# Patient Record
Sex: Female | Born: 1940 | Hispanic: No | Marital: Married | Smoking: Never smoker
Health system: Southern US, Community
[De-identification: ages and names within clinical notes are randomized; demographics above are authoritative.]

## PROBLEM LIST (undated history)

## (undated) DIAGNOSIS — Z923 Personal history of irradiation: Secondary | ICD-10-CM

## (undated) DIAGNOSIS — H409 Unspecified glaucoma: Secondary | ICD-10-CM

## (undated) DIAGNOSIS — I10 Essential (primary) hypertension: Secondary | ICD-10-CM

## (undated) DIAGNOSIS — M810 Age-related osteoporosis without current pathological fracture: Secondary | ICD-10-CM

## (undated) DIAGNOSIS — C50312 Malignant neoplasm of lower-inner quadrant of left female breast: Principal | ICD-10-CM

## (undated) DIAGNOSIS — C50919 Malignant neoplasm of unspecified site of unspecified female breast: Secondary | ICD-10-CM

## (undated) DIAGNOSIS — E785 Hyperlipidemia, unspecified: Secondary | ICD-10-CM

## (undated) HISTORY — DX: Essential (primary) hypertension: I10

## (undated) HISTORY — DX: Malignant neoplasm of unspecified site of unspecified female breast: C50.919

## (undated) HISTORY — DX: Malignant neoplasm of lower-inner quadrant of left female breast: C50.312

## (undated) HISTORY — DX: Hyperlipidemia, unspecified: E78.5

## (undated) HISTORY — DX: Age-related osteoporosis without current pathological fracture: M81.0

## (undated) HISTORY — PX: FOOT SURGERY: SHX648

## (undated) HISTORY — DX: Unspecified glaucoma: H40.9

---

## 1998-07-02 ENCOUNTER — Other Ambulatory Visit: Admission: RE | Admit: 1998-07-02 | Discharge: 1998-07-02 | Payer: Self-pay | Admitting: Internal Medicine

## 1998-07-16 ENCOUNTER — Ambulatory Visit (HOSPITAL_COMMUNITY): Admission: RE | Admit: 1998-07-16 | Discharge: 1998-07-16 | Payer: Self-pay | Admitting: Internal Medicine

## 1999-09-08 ENCOUNTER — Ambulatory Visit (HOSPITAL_COMMUNITY): Admission: RE | Admit: 1999-09-08 | Discharge: 1999-09-08 | Payer: Self-pay | Admitting: Internal Medicine

## 1999-09-08 ENCOUNTER — Encounter: Payer: Self-pay | Admitting: Internal Medicine

## 1999-09-08 ENCOUNTER — Other Ambulatory Visit: Admission: RE | Admit: 1999-09-08 | Discharge: 1999-09-08 | Payer: Self-pay | Admitting: Internal Medicine

## 2000-10-18 ENCOUNTER — Other Ambulatory Visit: Admission: RE | Admit: 2000-10-18 | Discharge: 2000-10-18 | Payer: Self-pay | Admitting: Internal Medicine

## 2001-10-20 ENCOUNTER — Other Ambulatory Visit: Admission: RE | Admit: 2001-10-20 | Discharge: 2001-10-20 | Payer: Self-pay | Admitting: Internal Medicine

## 2002-07-06 ENCOUNTER — Emergency Department (HOSPITAL_COMMUNITY): Admission: EM | Admit: 2002-07-06 | Discharge: 2002-07-06 | Payer: Self-pay | Admitting: Emergency Medicine

## 2002-07-06 ENCOUNTER — Encounter: Payer: Self-pay | Admitting: Emergency Medicine

## 2002-08-04 ENCOUNTER — Encounter: Admission: RE | Admit: 2002-08-04 | Discharge: 2002-08-04 | Payer: Self-pay | Admitting: Internal Medicine

## 2002-08-04 ENCOUNTER — Encounter: Payer: Self-pay | Admitting: Internal Medicine

## 2004-04-04 ENCOUNTER — Ambulatory Visit (HOSPITAL_COMMUNITY): Admission: RE | Admit: 2004-04-04 | Discharge: 2004-04-04 | Payer: Self-pay | Admitting: Gastroenterology

## 2005-06-10 ENCOUNTER — Encounter: Admission: RE | Admit: 2005-06-10 | Discharge: 2005-09-08 | Payer: Self-pay | Admitting: Internal Medicine

## 2010-03-21 ENCOUNTER — Encounter: Admission: RE | Admit: 2010-03-21 | Discharge: 2010-03-21 | Payer: Self-pay | Admitting: Internal Medicine

## 2010-04-04 ENCOUNTER — Emergency Department (HOSPITAL_COMMUNITY): Admission: EM | Admit: 2010-04-04 | Discharge: 2010-04-04 | Payer: Self-pay | Admitting: Emergency Medicine

## 2011-01-19 ENCOUNTER — Encounter
Admission: RE | Admit: 2011-01-19 | Discharge: 2011-01-19 | Payer: Self-pay | Source: Home / Self Care | Attending: Internal Medicine | Admitting: Internal Medicine

## 2011-03-18 LAB — CBC
Hemoglobin: 12.4 g/dL (ref 12.0–15.0)
MCHC: 34.3 g/dL (ref 30.0–36.0)
MCV: 91.4 fL (ref 78.0–100.0)
RBC: 3.94 MIL/uL (ref 3.87–5.11)
RDW: 13.6 % (ref 11.5–15.5)

## 2011-03-18 LAB — URINALYSIS, ROUTINE W REFLEX MICROSCOPIC
Bilirubin Urine: NEGATIVE
Ketones, ur: NEGATIVE mg/dL
Nitrite: NEGATIVE
Specific Gravity, Urine: 1.021 (ref 1.005–1.030)
Urobilinogen, UA: 0.2 mg/dL (ref 0.0–1.0)
pH: 8.5 — ABNORMAL HIGH (ref 5.0–8.0)

## 2011-03-18 LAB — COMPREHENSIVE METABOLIC PANEL
ALT: 26 U/L (ref 0–35)
AST: 24 U/L (ref 0–37)
Albumin: 3.9 g/dL (ref 3.5–5.2)
Alkaline Phosphatase: 50 U/L (ref 39–117)
Chloride: 102 mEq/L (ref 96–112)
GFR calc Af Amer: >60 mL/min (ref >60)
Potassium: 3.6 mEq/L (ref 3.5–5.1)
Sodium: 141 mEq/L (ref 135–145)
Total Bilirubin: 1 mg/dL (ref 0.3–1.2)

## 2011-03-18 LAB — DIFFERENTIAL
Basophils Absolute: 0 10*3/uL (ref 0.0–0.1)
Basophils Relative: 0 % (ref 0–1)
Eosinophils Relative: 1 % (ref 0–5)
Monocytes Absolute: 0.7 10*3/uL (ref 0.1–1.0)
Monocytes Relative: 6 % (ref 3–12)

## 2011-04-07 IMAGING — CR DG LUMBAR SPINE COMPLETE 4+V
5 series · 5 of 5 positions shown · non-contrast
Comparison: None.

CLINICAL DATA: Bilateral low back pain radiating to the hips and
legs, no acute injury

LUMBAR SPINE - COMPLETE 4+ VIEW

[view not recorded (1 of 5)]
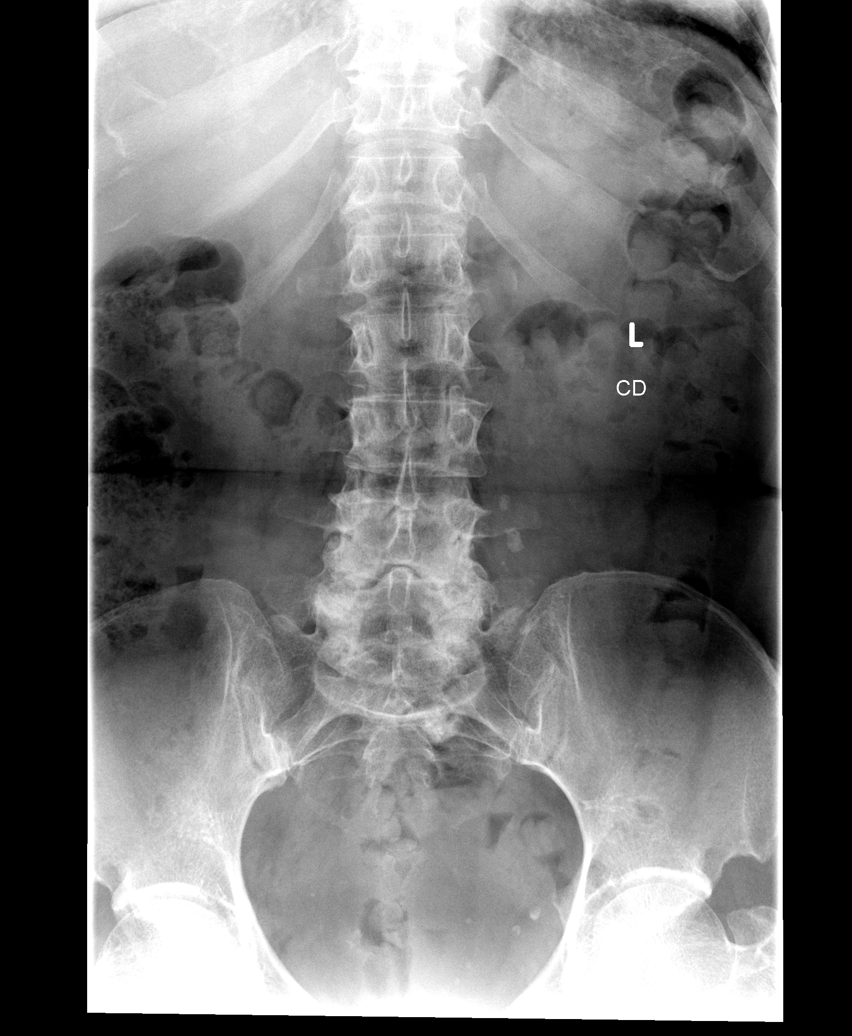

[view not recorded (2 of 5)]
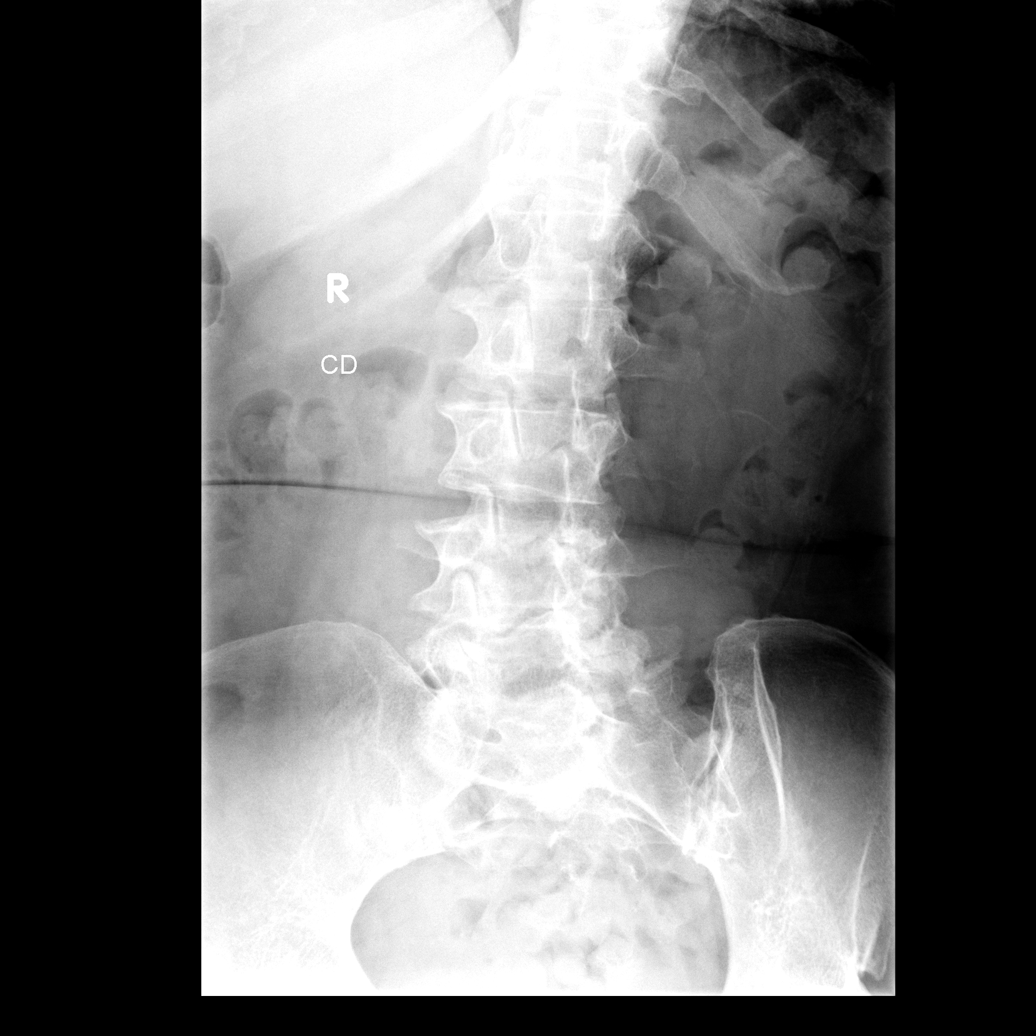

[view not recorded (3 of 5)]
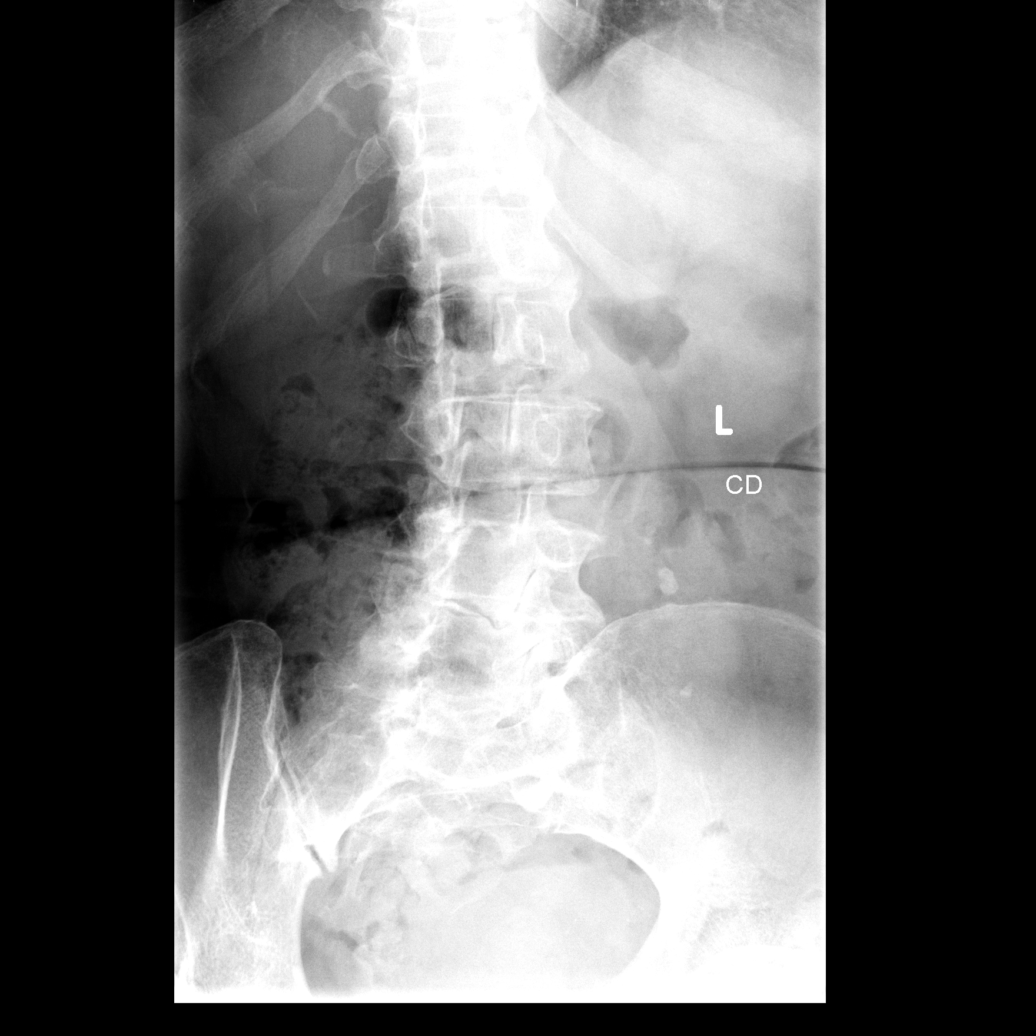

[view not recorded (4 of 5)]
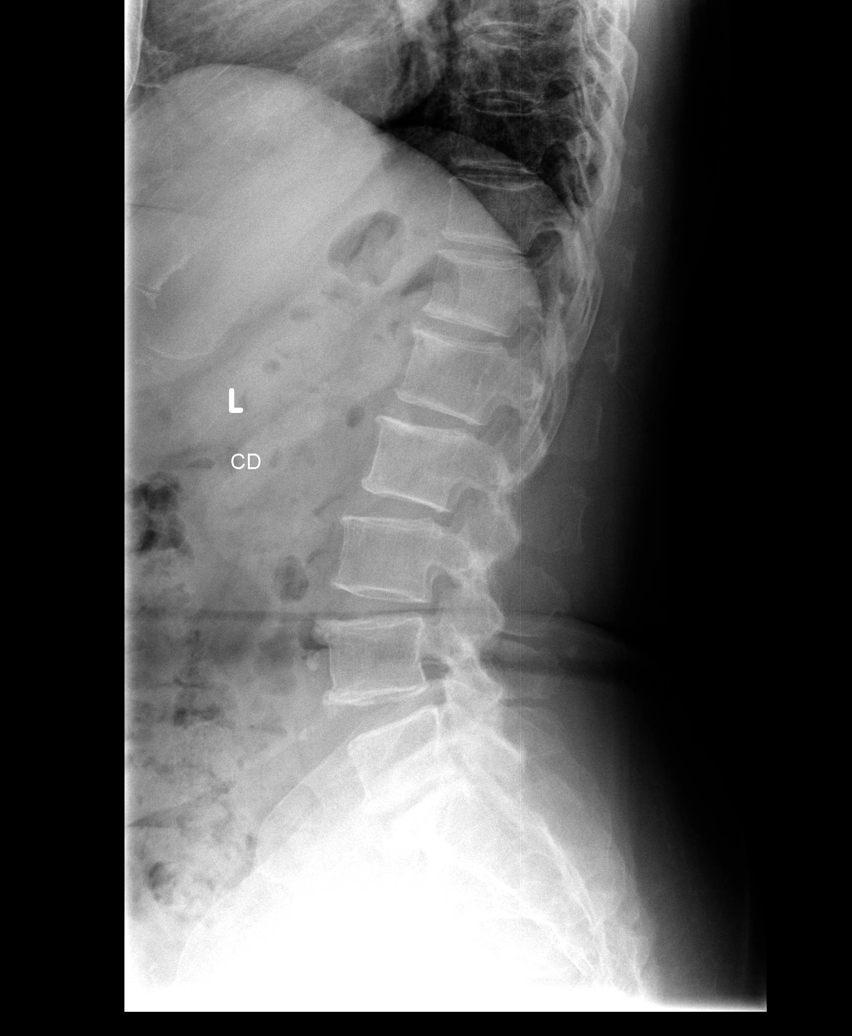

[view not recorded (5 of 5)]
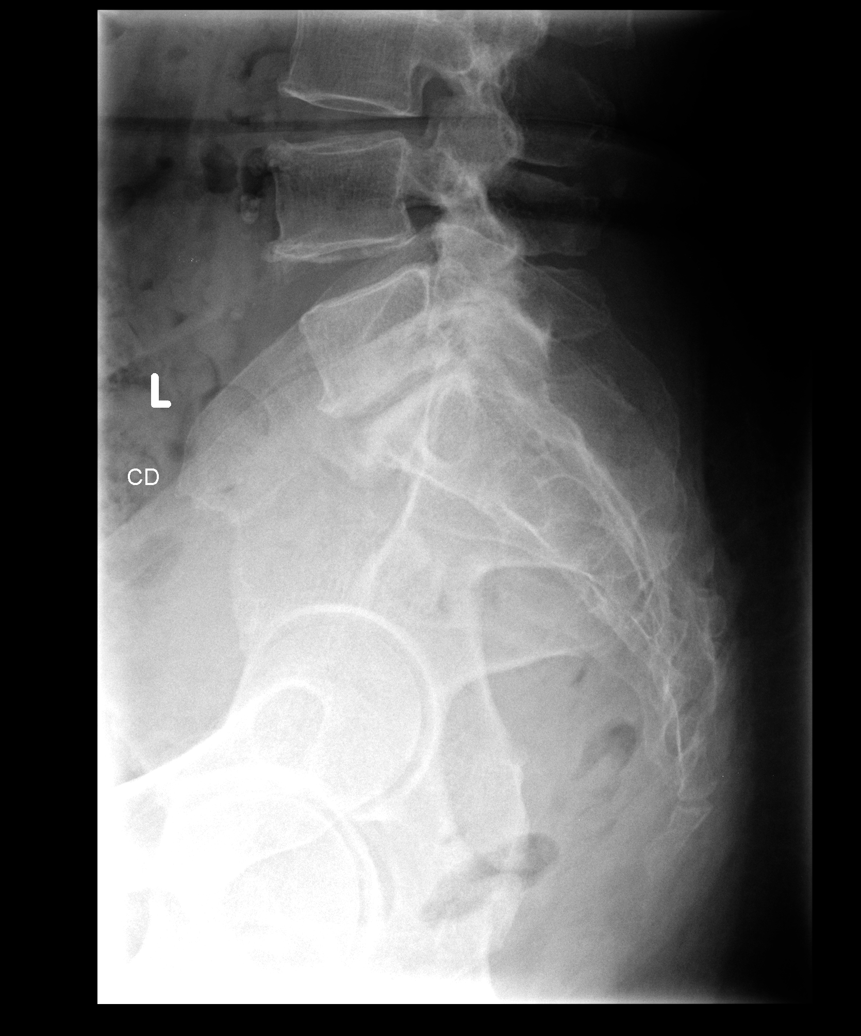

[5 of 5 positions shown; findings below may reference images not displayed]

FINDINGS: The lumbar vertebrae are in normal alignment.  There is
degenerative disc disease particularly at the L5 S1 level where
there is loss of disc space, sclerosis, and spurring.  No
compression deformity is seen.  There is degenerative change
involving the facet joints of L4-5 and L5-S1.  The SI joints appear
normal.
IMPRESSION: Degenerative disc disease at L5-S1.  Degenerative change involves
the facet joints of L4-5 and L5 S1 as well.

## 2011-05-15 NOTE — Op Note (Signed)
Cindy Whitaker, CRAGIN                          ACCOUNT NO.:  [id number]   MEDICAL RECORD NO.:  [id number]                   PATIENT TYPE:  AMB   LOCATION:  ENDO                                 FACILITY:  [facility]   PHYSICIAN:  Anselmo Rod, M.D.               DATE OF BIRTH:  [birth year]   DATE OF PROCEDURE:  04/04/2004  DATE OF DISCHARGE:                                 OPERATIVE REPORT   PROCEDURE:  Colonoscopy.   ENDOSCOPIST:  Charna Elizabeth, M.D.   INSTRUMENT USED:  Olympus video colonoscope.   INDICATIONS FOR PROCEDURE:  This is a 70 year old African American female  undergoing screening colonoscopy to rule out colonic polyps, masses, etc.   PROCEDURE PERFORMED:  Informed consent was procured from the patient.  The  patient fasted for eight hours prior to the procedure and prepped with a  bottle of magnesium citrate and a gallon of GOLYTELY the night prior to the  procedure.   PREPROCEDURE PHYSICAL EXAMINATION:  VITAL SIGNS:  The patient had stable  vital signs.  NECK:  Supple.  CHEST:  Clear to auscultation.  HEART:  S1 and S2 regular.  ABDOMEN:  Soft with normal bowel sounds.   DESCRIPTION OF PROCEDURE:  The patient was placed in the left lateral  decubitus position, sedated with 60 mg of Demerol and 6 mg of Versed  intravenously.  Once the patient was adequately sedated and maintained on  low flow oxygen and continuous cardiac monitoring, the Olympus video  colonoscope was advanced from the rectum to the cecum and terminal ileum  without difficulty.  The patient had a fairly good prep.  No masses, polyps,  erosions, ulcerations or diverticula was seen.  The appendiceal orifice and  ileocecal valve were clearly visualized and photographed.  Retroflexion in  the rectum revealed small internal hemorrhoids.  The patient tolerated the  procedure without immediate complications.   IMPRESSION:  Normal colonoscopy up to the terminal ileum except for small  nonbleeding internal  hemorrhoids.   RECOMMENDATIONS:  1. Continue a high fiber diet with liberal fluid intake.  2. Repeat CRC screening in the next 10 years unless the patient develops any     abnormal symptoms in the interim.  3. Outpatient followup as need arises in the future.                                               Anselmo Rod, M.D.    JNM/MEDQ  D:  04/04/2004  T:  04/04/2004  Job:  [id number]   cc:   Merlene Laughter. Renae Gloss, M.D.  [address]  [address]  [place]  [place] [address]  Fax: [phone]

## 2016-01-29 ENCOUNTER — Other Ambulatory Visit: Payer: Self-pay | Admitting: Internal Medicine

## 2016-01-29 DIAGNOSIS — Z1231 Encounter for screening mammogram for malignant neoplasm of breast: Secondary | ICD-10-CM

## 2016-03-13 ENCOUNTER — Encounter: Payer: Self-pay | Admitting: *Deleted

## 2016-03-13 ENCOUNTER — Telehealth: Payer: Self-pay | Admitting: *Deleted

## 2016-03-13 DIAGNOSIS — Z170 Estrogen receptor positive status [ER+]: Secondary | ICD-10-CM

## 2016-03-13 DIAGNOSIS — C50312 Malignant neoplasm of lower-inner quadrant of left female breast: Secondary | ICD-10-CM

## 2016-03-13 HISTORY — DX: Malignant neoplasm of lower-inner quadrant of left female breast: C50.312

## 2016-03-13 NOTE — Telephone Encounter (Signed)
Confirmed [facility] for 03/18/16 at 1215.  Instructions and contact information given.

## 2016-03-18 ENCOUNTER — Ambulatory Visit
Admission: RE | Admit: 2016-03-18 | Discharge: 2016-03-18 | Disposition: A | Discharge status: Home / Self Care | Payer: Medicare Other | Source: Ambulatory Visit | Attending: Radiation Oncology | Admitting: Radiation Oncology

## 2016-03-18 ENCOUNTER — Other Ambulatory Visit (HOSPITAL_BASED_OUTPATIENT_CLINIC_OR_DEPARTMENT_OTHER): Payer: Medicare Other

## 2016-03-18 ENCOUNTER — Ambulatory Visit: Payer: Medicare Other | Admitting: Physical Therapy

## 2016-03-18 ENCOUNTER — Telehealth: Payer: Self-pay | Admitting: Oncology

## 2016-03-18 ENCOUNTER — Ambulatory Visit (HOSPITAL_BASED_OUTPATIENT_CLINIC_OR_DEPARTMENT_OTHER): Payer: Medicare Other | Admitting: Oncology

## 2016-03-18 ENCOUNTER — Encounter: Payer: Self-pay | Admitting: Oncology

## 2016-03-18 ENCOUNTER — Ambulatory Visit: Payer: Self-pay | Admitting: Surgery

## 2016-03-18 VITALS — BP 167/67 | HR 67 | Temp 97.9°F | Resp 18 | Wt 171.5 lb

## 2016-03-18 DIAGNOSIS — C50312 Malignant neoplasm of lower-inner quadrant of left female breast: Secondary | ICD-10-CM

## 2016-03-18 DIAGNOSIS — D0512 Intraductal carcinoma in situ of left breast: Secondary | ICD-10-CM

## 2016-03-18 LAB — CBC WITH DIFFERENTIAL/PLATELET
BASO%: 0.3 % (ref 0.0–2.0)
Basophils Absolute: 0 10*3/uL (ref 0.0–0.1)
EOS ABS: 0.1 10*3/uL (ref 0.0–0.5)
EOS%: 1.4 % (ref 0.0–7.0)
HCT: 34.2 % — ABNORMAL LOW (ref 34.8–46.6)
HEMOGLOBIN: 12.2 g/dL (ref 11.6–15.9)
LYMPH%: 46.9 % (ref 14.0–49.7)
MCH: 30.7 pg (ref 25.1–34.0)
MCHC: 35.7 g/dL (ref 31.5–36.0)
MCV: 86.1 fL (ref 79.5–101.0)
MONO#: 0.8 10*3/uL (ref 0.1–0.9)
MONO%: 8.3 % (ref 0.0–14.0)
NEUT%: 43.1 % (ref 38.4–76.8)
NEUTROS ABS: 4.1 10*3/uL (ref 1.5–6.5)
Platelets: 242 10*3/uL (ref 145–400)
RBC: 3.97 10*6/uL (ref 3.70–5.45)
RDW: 12.9 % (ref 11.2–14.5)
WBC: 9.5 10*3/uL (ref 3.9–10.3)
lymph#: 4.5 10*3/uL — ABNORMAL HIGH (ref 0.9–3.3)

## 2016-03-18 LAB — COMPREHENSIVE METABOLIC PANEL
ALK PHOS: 63 U/L (ref 40–150)
ALT: 16 U/L (ref 0–55)
ANION GAP: 6 meq/L (ref 3–11)
AST: 20 U/L (ref 5–34)
Albumin: 3.8 g/dL (ref 3.5–5.0)
BUN: 15.9 mg/dL (ref 7.0–26.0)
CHLORIDE: 106 meq/L (ref 98–109)
CO2: 27 mEq/L (ref 22–29)
CREATININE: 1.1 mg/dL (ref 0.6–1.1)
Calcium: 9.6 mg/dL (ref 8.4–10.4)
EGFR: 50 mL/min/{1.73_m2} — ABNORMAL LOW (ref >90)
Glucose: 108 mg/dl (ref 70–140)
Potassium: 3.9 mEq/L (ref 3.5–5.1)
SODIUM: 139 meq/L (ref 136–145)
TOTAL PROTEIN: 8.1 g/dL (ref 6.4–8.3)
Total Bilirubin: 0.63 mg/dL (ref 0.20–1.20)

## 2016-03-18 NOTE — Progress Notes (Signed)
Radiation Oncology         [phone] ________________________________  Initial outpatient Consultation  Name: Cindy Whitaker MRN: [id number]  Date: 03/18/2016  DOB: 04-02-1941  TD:DUKGURK Sandrea Hughs, MD  Erroll Luna, MD   REFERRING PHYSICIAN: Erroll Luna, MD  DIAGNOSIS:    ICD-9-CM ICD-10-CM   1. 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 female who presented with a Left breast mass on screening mammography, 63m on [place] axilla normal on [place]  Biopsy showed Low grade DCIS arising from a papilloma with characteristics as described above in the diagnosis.  She is otherwise in her USOH.  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    PAST SURGICAL HISTORY: Past Surgical History  Procedure Laterality Date  . Foot surgery      FAMILY HISTORY: family history is not on file.  SOCIAL HISTORY:  reports that she has never smoked. She does not have any smokeless tobacco history on file. She reports that she does not drink alcohol or use illicit drugs.  ALLERGIES: Review of patient's allergies indicates no known allergies.  MEDICATIONS:  Current Outpatient Prescriptions  Medication Sig Dispense Refill  . amLODipine (NORVASC) 5 MG tablet TK 1 T PO QD  5  . atorvastatin (LIPITOR) 10 MG tablet Take 10 mg by mouth daily.    . dorzolamide-timolol (COSOPT) 22.3-6.8 MG/ML ophthalmic solution Place 1 drop into the left eye 2 (two) times daily.    .Marland Kitchenlatanoprost (XALATAN) 0.005 % ophthalmic solution Place 1 drop into both eyes at bedtime.      .Marland Kitchenlosartan (COZAAR) 50 MG tablet Take 50 mg by mouth daily.     No current facility-administered medications for this encounter.    REVIEW OF SYSTEMS:  Notable for that above.   PHYSICAL EXAM:    Vitals with BMI 03/18/2016  Weight 1270lbs 8 oz  Systolic 1623 Diastolic 67  Pulse 67  Respirations 18   General: Alert and oriented, in no acute distress HEENT: Head is normocephalic. Extraocular movements are intact. Oropharynx is clear. Neck: Neck is supple, no palpable cervical or supraclavicular lymphadenopathy. Heart: Regular in rate and rhythm with no murmurs, rubs, or gallops. Chest: Clear to auscultation bilaterally, with no rhonchi, wheezes, or rales. Abdomen: Soft, nontender, nondistended, with no rigidity or guarding. Extremities: No cyanosis or edema. Lymphatics: see Neck Exam Skin: No concerning lesions. Musculoskeletal: symmetric strength and muscle tone throughout. Neurologic: Cranial nerves II through XII are grossly intact. No obvious focalities. Speech is fluent. Coordination is intact. Psychiatric: Judgment and insight are intact. Affect is appropriate. Breasts: lower left breast thickening, about 1.5-2cm span, at biopsy site . No other palpable masses appreciated in the breasts or axillae bilaterally .    ECOG = 0  0 - Asymptomatic (Fully active, able to carry on all predisease activities without restriction)  1 - Symptomatic but completely ambulatory (Restricted in physically strenuous activity but ambulatory and able to carry out work of a light or sedentary nature. For example, light housework, office work)  2 - Symptomatic, <50% in bed  during the day (Ambulatory and capable of all self care but unable to carry out any work activities. Up and about more than 50% of waking hours)  3 - Symptomatic, >50% in bed, but not bedbound (Capable of only limited self-care, confined to bed or chair 50% or more of waking hours)  4 - Bedbound (Completely disabled. Cannot  carry on any self-care. Totally confined to bed or chair)  5 - Death   [name] MM, Creech RH, Tormey DC, et al. [phone]). "Toxicity and response criteria of the [facility]". [place] Oncol. 5 (6): 649-55   LABORATORY DATA:  Lab Results  Component Value Date   WBC 9.5 03/18/2016   HGB 12.2 03/18/2016   HCT 34.2* 03/18/2016   MCV 86.1 03/18/2016   PLT 242 03/18/2016   CMP     Component Value Date/Time   NA 139 03/18/2016 1240   NA 141 04/04/2010 0236   K 3.9 03/18/2016 1240   K 3.6 04/04/2010 0236   CL 102 04/04/2010 0236   CO2 27 03/18/2016 1240   CO2 31 04/04/2010 0236   GLUCOSE 108 03/18/2016 1240   GLUCOSE 124* 04/04/2010 0236   BUN 15.9 03/18/2016 1240   BUN 23 04/04/2010 0236   CREATININE 1.1 03/18/2016 1240   CREATININE 0.98 04/04/2010 0236   CALCIUM 9.6 03/18/2016 1240   CALCIUM 9.2 04/04/2010 0236   PROT 8.1 03/18/2016 1240   PROT 7.8 04/04/2010 0236   ALBUMIN 3.8 03/18/2016 1240   ALBUMIN 3.9 04/04/2010 0236   AST 20 03/18/2016 1240   AST 24 04/04/2010 0236   ALT 16 03/18/2016 1240   ALT 26 04/04/2010 0236   ALKPHOS 63 03/18/2016 1240   ALKPHOS 50 04/04/2010 0236   BILITOT 0.63 03/18/2016 1240   BILITOT 1.0 04/04/2010 0236   GFRNONAA 57* 04/04/2010 0236   GFRAA  04/04/2010 0236    >60        The eGFR has been calculated using the MDRD equation. This calculation has not been validated in all clinical situations. eGFR's persistently <60 mL/min signify possible Chronic Kidney Disease.         RADIOGRAPHY: No results found.    IMPRESSION/PLAN: Low grade left breast DCIS, ER+  She has been discussed at our multidisciplinary tumor board.  The consensus is that she would be a good candidate for breast conservation. I talked to her about the option of a mastectomy and informed her that her expected overall survival would be equivalent between mastectomy and breast conservation, based upon randomized controlled data. She is  enthusiastic about breast conservation.  It was a pleasure meeting the patient today. We discussed the risks, benefits, and side effects of radiotherapy. I recommend reviewing her final pathology before determining if radiotherapy to the Left breast is needed.  We discussed that radiation would take approximately 5-6 weeks to complete and that I would give the patient a few weeks to heal following surgery before starting treatment planning. We spoke about acute effects including skin irritation and fatigue as well as much less common late effects including internal organ injury or irritation. We spoke about the latest technology that is used to minimize the risk of late effects for patients undergoing radiotherapy to the breast or chest wall. No guarantees of treatment were given.   As discussed, I am not sure RT will be warranted for what appears to be relatively low risk disease.  We'll discuss her pathology post surgery and determine if followup and/or  treatment planning is needed.    __________________________________________   Eppie Gibson, MD

## 2016-03-18 NOTE — H&P (Signed)
Cindy Whitaker 03/18/2016 8:00 AM Location: [facility] Patient #: [id number] DOB: January 03, 1941 Undefined / Language: Cindy Whitaker / Race: Undefined Female  History of Present Illness Cindy Moores A. Hasaan Radde MD; 03/18/2016 2:49 PM) Patient words: Pt sent at the request of Dr Cindy Whitaker for left breast mass detected on mammogram. Bx shows 9 mm DCIS lo grade withinpapilloma. Pt has no breast pain, mass or discharge. No family history of breast cancer.  The patient is a 75 year old female.   Other Problems Cindy Slipper, RN; 03/18/2016 8:00 AM) Arthritis Back Pain Bladder Problems High blood pressure Hypercholesterolemia  Past Surgical History Cindy Slipper, RN; 03/18/2016 8:00 AM) Foot Surgery Right.  Diagnostic Studies History Cindy Slipper, RN; 03/18/2016 8:00 AM) Colonoscopy within last year Mammogram within last year Pap Smear 1-5 years ago  Medication History Cindy Slipper, RN; 03/18/2016 8:00 AM) No Current Medications Medications Reconciled  Social History Cindy Slipper, RN; 03/18/2016 8:00 AM) Caffeine use Coffee, Tea. No alcohol use No drug use Tobacco use Never smoker.  Family History Cindy Slipper, RN; 03/18/2016 8:00 AM) Hypertension Brother, Sister. Thyroid problems Sister.  Pregnancy / Birth History Cindy Slipper, RN; 03/18/2016 8:00 AM) Age at menarche 55 years. Age of menopause 69-50 Gravida 31 Maternal age 4-25 Para 3     Review of Systems Cindy Slipper RN; 03/18/2016 8:00 AM) General Not Present- Appetite Loss, Chills, Fatigue, Fever, Night Sweats, Weight Gain and Weight Loss. Skin Not Present- Change in Wart/Mole, Dryness, Hives, Jaundice, New Lesions, Non-Healing Wounds, Rash and Ulcer. HEENT Present- Seasonal Allergies and Wears glasses/contact lenses. Not Present- Earache, Hearing Loss, Hoarseness, Nose Bleed, Oral Ulcers, Ringing in the Ears, Sinus Pain, Sore Throat, Visual Disturbances and Yellow Eyes. Respiratory Present- Snoring. Not  Present- Bloody sputum, Chronic Cough, Difficulty Breathing and Wheezing. Breast Not Present- Breast Mass, Breast Pain, Nipple Discharge and Skin Changes. Cardiovascular Not Present- Chest Pain, Difficulty Breathing Lying Down, Leg Cramps, Palpitations, Rapid Heart Rate, Shortness of Breath and Swelling of Extremities. Gastrointestinal Not Present- Abdominal Pain, Bloating, Bloody Stool, Change in Bowel Habits, Chronic diarrhea, Constipation, Difficulty Swallowing, Excessive gas, Gets full quickly at meals, Hemorrhoids, Indigestion, Nausea, Rectal Pain and Vomiting. Female Genitourinary Present- Nocturia. Not Present- Frequency, Painful Urination, Pelvic Pain and Urgency. Musculoskeletal Present- Back Pain. Not Present- Joint Pain, Joint Stiffness, Muscle Pain, Muscle Weakness and Swelling of Extremities. Neurological Not Present- Decreased Memory, Fainting, Headaches, Numbness, Seizures, Tingling, Tremor, Trouble walking and Weakness. Psychiatric Not Present- Anxiety, Bipolar, Change in Sleep Pattern, Depression, Fearful and Frequent crying. Endocrine Not Present- Cold Intolerance, Excessive Hunger, Hair Changes, Heat Intolerance, Hot flashes and New Diabetes. Hematology Not Present- Easy Bruising, Excessive bleeding, Gland problems, HIV and Persistent Infections.   Physical Exam (Cindy Gascoigne A. Hannia Matchett MD; 03/18/2016 2:50 PM)  General Mental Status-Alert. General Appearance-Consistent with stated age. Hydration-Well hydrated. Voice-Normal.  Head and Neck Head-normocephalic, atraumatic with no lesions or palpable masses. Trachea-midline. Thyroid Gland Characteristics - normal size and consistency.  Eye Eyeball - Bilateral-Extraocular movements intact. Sclera/Conjunctiva - Bilateral-No scleral icterus.  Chest and Lung Exam Chest and lung exam reveals -quiet, even and easy respiratory effort with no use of accessory muscles and on auscultation, normal breath sounds, no  adventitious sounds and normal vocal resonance. Inspection Chest Wall - Normal. Back - normal.  Breast Breast - Left-Symmetric, Non Tender, No Biopsy scars, no Dimpling, No Inflammation, No Lumpectomy scars, No Mastectomy scars, No [place]. Breast - Right-Symmetric, Non Tender, No Biopsy scars, no Dimpling, No Inflammation, No Lumpectomy scars, No Mastectomy  scars, No Peau d' [place]. Breast Lump-No Palpable Breast Mass. Note: small medial left breast hematoma  Cardiovascular Cardiovascular examination reveals -normal heart sounds, regular rate and rhythm with no murmurs and normal pedal pulses bilaterally.  Abdomen Inspection Inspection of the abdomen reveals - No Hernias. Skin - Scar - no surgical scars. Palpation/Percussion Palpation and Percussion of the abdomen reveal - Soft, Non Tender, No Rebound tenderness, No Rigidity (guarding) and No hepatosplenomegaly. Auscultation Auscultation of the abdomen reveals - Bowel sounds normal.  Neurologic Neurologic evaluation reveals -alert and oriented x 3 with no impairment of recent or remote memory. Mental Status-Normal.  Musculoskeletal Normal Exam - Left-Upper Extremity Strength Normal and Lower Extremity Strength Normal. Normal Exam - Right-Upper Extremity Strength Normal and Lower Extremity Strength Normal.  Lymphatic Head & Neck  General Head & Neck Lymphatics: Bilateral - Description - Normal. Axillary  General Axillary Region: Bilateral - Description - Normal. Tenderness - Non Tender. Femoral & Inguinal - Did not examine.    Assessment & Plan (Cindy Auble A. Johnel Yielding MD; 03/18/2016 2:51 PM)  DCIS (DUCTAL CARCINOMA IN SITU), LEFT (D05.12) Impression: discussed breast conservation vs mastectomy.  Pt has opted for left breast lumpectomy  Risk of lumpectomy include bleeding, infection, seroma, more surgery, use of seed/wire, wound care, cosmetic deformity and the need for other treatments, death , blood  clots, death. Pt agrees to proceed.  Current Plans Pt Education - [facility] Breast Biopsy HCI: discussed with patient and provided information. We discussed the staging and pathophysiology of breast cancer. We discussed all of the different options for treatment for breast cancer including surgery, chemotherapy, radiation therapy, Herceptin, and antiestrogen therapy. We discussed a sentinel lymph node biopsy as she does not appear to having lymph node involvement right now. We discussed the performance of that with injection of radioactive tracer and blue dye. We discussed that she would have an incision underneath her axillary hairline. We discussed that there is a bout a 10-20% chance of having a positive node with a sentinel lymph node biopsy and we will await the permanent pathology to make any other first further decisions in terms of her treatment. One of these options might be to return to the operating room to perform an axillary lymph node dissection. We discussed about a 1-2% risk lifetime of chronic shoulder pain as well as lymphedema associated with a sentinel lymph node biopsy. We discussed the options for treatment of the breast cancer which included lumpectomy versus a mastectomy. We discussed the performance of the lumpectomy with a wire placement. We discussed a 10-20% chance of a positive margin requiring reexcision in the operating room. We also discussed that she may need radiation therapy or antiestrogen therapy or both if she undergoes lumpectomy. We discussed the mastectomy and the postoperative care for that as well. We discussed that there is no difference in her survival whether she undergoes lumpectomy with radiation therapy or antiestrogen therapy versus a mastectomy. There is a slight difference in the local recurrence rate being 3-5% with lumpectomy and about 1% with a mastectomy. We discussed the risks of operation including bleeding, infection, possible reoperation. She understands  her further therapy will be based on what her stages at the time of her operation.  Pt Education - [facility] Breast Cancer Information Given - [facility] "Breast Journey" Package You are being scheduled for surgery - Our schedulers will call you.  You should hear from our office's scheduling department within 5 working days about the location, date, and time of surgery.  We try to make accommodations for patient's preferences in scheduling surgery, but sometimes the OR schedule or the surgeon's schedule prevents [place] from making those accommodations.  If you have not heard from our office [phone]) in 5 working days, call the office and ask for your surgeon's nurse.  If you have other questions about your diagnosis, plan, or surgery, call the office and ask for your surgeon's nurse.

## 2016-03-18 NOTE — Progress Notes (Signed)
[place]  Telephone:([phone] Fax:([phone]     ID: Cindy Whitaker DOB: May 03, 1941  MR#: [id number]  KCL#:[id number]  Patient Care Team: Haywood Pao, MD as PCP - General (Internal Medicine) Chauncey Cruel, MD as Consulting Physician (Oncology) Erroll Luna, MD as Consulting Physician (General Surgery) Eppie Gibson, MD as Attending Physician (Radiation Oncology) Juanita Craver, MD as Consulting Physician (Gastroenterology) Iven Finn, DMD (Dentistry) Cherlyn Roberts, OD (Optometry) PCP: Haywood Pao, MD OTHER MD:  CHIEF COMPLAINT: Ductal carcinoma in situ  CURRENT TREATMENT: Awaiting definitive surgery   BREAST CANCER HISTORY: The patient had routine screening mammography at [facility] 03/02/2016 with tomosynthesis. This showed the breast density to be category be. In the left breast lower inner quadrant there was a new oval mass and ultrasound performed the next day confirmed a 9 mm lobulated mass which was hypoechoic and showed increased vascularity. The left axilla was sonographically benign.  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  Her subsequent history is as detailed below  INTERVAL HISTORY: Cindy Whitaker was evaluated in the multidisciplinary breast cancer clinic 03/18/2016, accompanied by her husband Cindy Whitaker and their daughter Cindy Whitaker. Her case was also presented in the multidisciplinary breast cancer conference that same morning. At that time a preliminary plan was proposed: Breast conserving surgery, with consideration of radiation depending on margin and other pathology results, then anti-estrogens.  REVIEW OF SYSTEMS: There were no specific symptoms leading to the original mammogram, which was routinely  scheduled. The patient denies unusual headaches, visual changes, nausea, vomiting, stiff neck, dizziness, or gait imbalance. There has been no cough, phlegm production, or pleurisy, no chest pain or pressure, and no change in bowel or bladder habits. The patient denies fever, rash, bleeding, unexplained fatigue or unexplained weight loss. She admits to some low back pain and other areas of arthritis which are not more intense or persistent in discomfort than prior. She has rare headaches. [facility]. A detailed review of systems was otherwise entirely negative.  PAST MEDICAL HISTORY: Past Medical History  Diagnosis Date  . 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 Breast cancer ([place])   . Hypertension   . Osteoporosis   . Hyperlipidemia   . Glaucoma     PAST SURGICAL HISTORY: Past Surgical History  Procedure Laterality Date  . Foot surgery      FAMILY HISTORY History reviewed. No pertinent family history. The patient's father died at the age of 2, from "old age" the patient's mother died from tuberculosis at the age of 5. The patient has 3 brothers, 5 sisters. There is no history of breast or ovarian cancer in the family to her knowledge  GYNECOLOGIC HISTORY:  No LMP recorded. Menarche age 24, first live birth age 41. The patient is GX P3. She stopped having periods in April 1995. She did not take hormone replacement. She never used oral contraceptives.  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 She is originally from [place].  Her husband Cindy Whitaker is a retired Education officer, museum. Their daughter Cindy Whitaker lives in [place] and is a Programmer, systems; daughter Cindy Whitaker Lives in [place] where she works as a Research scientist (physical sciences).  Son Cindy Whitaker this in [place] where he works as a Government social research officer. The patient has 3 grandchildren. She attends a local [employer]: Not in place   HEALTH MAINTENANCE: Social History  Substance Use Topics  . Smoking status: Never Smoker   . Smokeless tobacco: None  . Alcohol Use: No     Colonoscopy:09/17/2014/[facility]  PAP: 2014  Bone density: 01/28/2016, lowest T score -1.7   Lipid panel:  No Known Allergies  No current outpatient prescriptions on file.   No current facility-administered medications for this visit.    OBJECTIVE: Middle-aged African woman who appears younger than stated age  48 Vitals:   03/18/16 1304  BP: 167/67  Pulse: 67  Temp: 97.9 F (36.6 C)  Resp: 18     There is no height on file to calculate BMI.    ECOG FS:0 - Asymptomatic  Ocular: Sclerae unicteric, pupils equal, round and reactive to light Ear-nose-throat: Oropharynx clear and moist Lymphatic: No cervical or supraclavicular adenopathy Lungs no rales or rhonchi, good excursion bilaterally Heart regular rate and rhythm, no murmur appreciated Abd soft, nontender, positive bowel sounds MSK no focal spinal tenderness, no joint edema Neuro: non-focal, well-oriented, appropriate affect Breasts:  THE RIGHT BREAST IS UNREMARKABLE. THE LEFT BREAST IS STATUS POST RECENT BIOPSY. THERE ARE NO PALPABLE MASSES. THERE ARE NO SKIN OR NIPPLE CHANGES OF CONCERN. THE LEFT AXILLA IS BENIGN.   LAB RESULTS:  CMP     Component Value Date/Time   NA 139 03/18/2016 1240   NA 141 04/04/2010 0236   K 3.9 03/18/2016 1240   K 3.6 04/04/2010 0236   CL 102 04/04/2010 0236   CO2 27 03/18/2016 1240   CO2 31 04/04/2010 0236   GLUCOSE 108 03/18/2016 1240   GLUCOSE 124* 04/04/2010 0236   BUN 15.9 03/18/2016 1240   BUN 23 04/04/2010 0236   CREATININE 1.1 03/18/2016 1240   CREATININE 0.98 04/04/2010 0236   CALCIUM 9.6 03/18/2016 1240   CALCIUM 9.2 04/04/2010 0236   PROT 8.1 03/18/2016 1240   PROT 7.8 04/04/2010 0236   ALBUMIN 3.8 03/18/2016 1240   ALBUMIN 3.9 04/04/2010 0236   AST 20 03/18/2016 1240   AST 24  04/04/2010 0236   ALT 16 03/18/2016 1240   ALT 26 04/04/2010 0236   ALKPHOS 63 03/18/2016 1240   ALKPHOS 50 04/04/2010 0236   BILITOT 0.63 03/18/2016 1240   BILITOT 1.0 04/04/2010 0236   GFRNONAA 57* 04/04/2010 0236   GFRAA  04/04/2010 0236    >60        The eGFR has been calculated using the MDRD equation. This calculation has not been validated in all clinical situations. eGFR's persistently <60 mL/min signify possible Chronic Kidney Disease.    INo results found for: SPEP, UPEP  Lab Results  Component Value Date   WBC 9.5 03/18/2016   NEUTROABS 4.1 03/18/2016   HGB 12.2 03/18/2016   HCT 34.2* 03/18/2016   MCV 86.1 03/18/2016   PLT 242 03/18/2016      Chemistry      Component Value Date/Time   NA 139 03/18/2016 1240   NA 141 04/04/2010 0236   K 3.9 03/18/2016 1240   K 3.6 04/04/2010 0236   CL 102 04/04/2010 0236   CO2 27 03/18/2016 1240   CO2 31 04/04/2010 0236   BUN 15.9 03/18/2016 1240   BUN 23 04/04/2010 0236   CREATININE 1.1 03/18/2016 1240   CREATININE 0.98  04/04/2010 0236      Component Value Date/Time   CALCIUM 9.6 03/18/2016 1240   CALCIUM 9.2 04/04/2010 0236   ALKPHOS 63 03/18/2016 1240   ALKPHOS 50 04/04/2010 0236   AST 20 03/18/2016 1240   AST 24 04/04/2010 0236   ALT 16 03/18/2016 1240   ALT 26 04/04/2010 0236   BILITOT 0.63 03/18/2016 1240   BILITOT 1.0 04/04/2010 0236       No results found for: LABCA2  No components found for: LABCA125  No results for input(s): INR in the last 168 hours.  Urinalysis    Component Value Date/Time   COLORURINE YELLOW 04/04/2010 0228   APPEARANCEUR CLEAR 04/04/2010 0228   LABSPEC 1.021 04/04/2010 0228   PHURINE 8.5* 04/04/2010 0228   GLUCOSEU NEGATIVE 04/04/2010 0228   HGBUR NEGATIVE 04/04/2010 0228   BILIRUBINUR NEGATIVE 04/04/2010 0228   KETONESUR NEGATIVE 04/04/2010 0228   PROTEINUR 30* 04/04/2010 0228   UROBILINOGEN 0.2 04/04/2010 0228   NITRITE NEGATIVE 04/04/2010 0228   LEUKOCYTESUR  SMALL* 04/04/2010 0228      ELIGIBLE FOR AVAILABLE RESEARCH PROTOCOL: No   STUDIES:  outside studies reviewed   ASSESSMENT: 75 y.o. [place] woman Status post left breast biopsy 03/09/2016 for ductal carcinoma in situ, grade 1, estrogen and progesterone receptor positive  (1) definitive surgery pending  (2) consider adjuvant radiation  (3) consider adjuvant anti-estrogens  (4) osteopenia, with a T score of -1.7 on bone density scan January 2017.  PLAN: We spent the better part of today's hour-long appointment discussing the biology of breast cancer in general, and the specifics of the patient's tumor in particular. The patient understands that in noninvasive ductal carcinoma, also called ductal carcinoma in situ ("DCIS") the breast cancer cells remain trapped in the ducts were they started. They cannot travel to a vital organ. For that reason these cancers in themselves are not life-threatening.  If the whole breast is removed then all the ducts are removed and since the cancer cells are trapped in the ducts, the cure rate with mastectomy for noninvasive breast cancer is approximately 99%. Nevertheless we recommend lumpectomy, because there is no survival advantage to mastectomy and because the cosmetic result is generally superior with breast conservation.  In patients who Whitaker a deleterious mutations such as for example BRCA, the risk of a new breast cancer developing may be sufficiently great that the patient may choose bilateral mastectomies. However if the patient wishes to keep her breasts in that situation it is safe to do so. That would require intensified screening, which generally means not only yearly mammography but a yearly breast MRI as well. Of course, if there is a deleterious mutation bilateral oophorectomy would be necessary as there is no standard screening protocol for ovarian cancer.  Since Keyaira is considering keeping her breasts, there will be some risk of  recurrence. The recurrence can only be in the same breast since, again, the cells are trapped in the ducts. The risk of local recurrence is cut by more than half with radiation, which is being considered in this situation.  In estrogen receptor positive cancers like Anshu's, anti-estrogens can also be considered. They will further reduce the risk of recurrence by one half. In addition anti-estrogens will lower the risk of a new breast cancer developing in either breast also by one half. That risk approaches 1% per year. Anti-estrogens reduce it to 1/2% per year  Accordingly the overall plan is for surgery, then consideration of radiation, then a discussion  of anti-estrogens.Raschelle has a good understanding of the overall plan. She agrees with it. She knows the goal of treatment in her case is cure. She will call with any problems that may develop before her next visit here.  Chauncey Cruel, MD   03/18/2016 3:09 PM Medical Oncology and Hematology [facility] [address] [place], [place] [address] Tel. [phone]    Fax. [phone]

## 2016-03-18 NOTE — Telephone Encounter (Signed)
appt made and avs to be printed at end of visit

## 2016-03-23 ENCOUNTER — Telehealth: Payer: Self-pay | Admitting: *Deleted

## 2016-03-23 NOTE — Telephone Encounter (Signed)
Spoke to pt concerning Cindy Whitaker 3.22.17. Denies questions or concerns regarding dx or treatment care plan. Encourage pt to call with needs. Received verbal understanding.

## 2016-04-07 ENCOUNTER — Encounter (HOSPITAL_BASED_OUTPATIENT_CLINIC_OR_DEPARTMENT_OTHER): Payer: Self-pay | Admitting: *Deleted

## 2016-04-13 ENCOUNTER — Other Ambulatory Visit: Payer: Self-pay

## 2016-04-13 ENCOUNTER — Encounter (HOSPITAL_BASED_OUTPATIENT_CLINIC_OR_DEPARTMENT_OTHER)
Admission: RE | Admit: 2016-04-13 | Discharge: 2016-04-13 | Disposition: A | Discharge status: Home / Self Care | Payer: Medicare Other | Source: Ambulatory Visit | Attending: Surgery | Admitting: Surgery

## 2016-04-13 DIAGNOSIS — I10 Essential (primary) hypertension: Secondary | ICD-10-CM | POA: Diagnosis not present

## 2016-04-13 DIAGNOSIS — M1990 Unspecified osteoarthritis, unspecified site: Secondary | ICD-10-CM | POA: Diagnosis not present

## 2016-04-13 DIAGNOSIS — N6012 Diffuse cystic mastopathy of left breast: Secondary | ICD-10-CM | POA: Diagnosis not present

## 2016-04-13 DIAGNOSIS — D0512 Intraductal carcinoma in situ of left breast: Secondary | ICD-10-CM | POA: Diagnosis not present

## 2016-04-13 DIAGNOSIS — D242 Benign neoplasm of left breast: Secondary | ICD-10-CM | POA: Diagnosis not present

## 2016-04-13 DIAGNOSIS — E7800 Pure hypercholesterolemia, unspecified: Secondary | ICD-10-CM | POA: Diagnosis not present

## 2016-04-14 NOTE — Anesthesia Preprocedure Evaluation (Addendum)
Anesthesia Evaluation  Patient identified by MRN, date of birth, ID band Patient awake    Reviewed: Allergy & Precautions, NPO status , Patient's Chart, lab work & pertinent test results  Airway Mallampati: III  TM Distance: >3 FB Neck ROM: Full    Dental  (+) Teeth Intact   Pulmonary neg pulmonary ROS,    breath sounds clear to auscultation       Cardiovascular hypertension, Pt. on medications  Rhythm:Regular Rate:Normal     Neuro/Psych negative neurological ROS  negative psych ROS   GI/Hepatic negative GI ROS, Neg liver ROS,   Endo/Other  negative endocrine ROS  Renal/GU negative Renal ROS  negative genitourinary   Musculoskeletal negative musculoskeletal ROS (+)   Abdominal Normal abdominal exam  (+)   Peds negative pediatric ROS (+)  Hematology negative hematology ROS (+)   Anesthesia Other Findings   Reproductive/Obstetrics negative OB ROS                            03/2016 EKG: normal sinus rhythm.   Anesthesia Physical Anesthesia Plan  ASA: II  Anesthesia Plan: General   Post-op Pain Management:    Induction: Intravenous  Airway Management Planned: LMA  Additional Equipment:   Intra-op Plan:   Post-operative Plan: Extubation in OR  Informed Consent: I have reviewed the patients History and Physical, chart, labs and discussed the procedure including the risks, benefits and alternatives for the proposed anesthesia with the patient or authorized representative who has indicated his/her understanding and acceptance.   Dental advisory given  Plan Discussed with: CRNA  Anesthesia Plan Comments:         Anesthesia Quick Evaluation

## 2016-04-15 ENCOUNTER — Ambulatory Visit (HOSPITAL_BASED_OUTPATIENT_CLINIC_OR_DEPARTMENT_OTHER): Payer: Medicare Other | Admitting: Anesthesiology

## 2016-04-15 ENCOUNTER — Encounter (HOSPITAL_BASED_OUTPATIENT_CLINIC_OR_DEPARTMENT_OTHER)
Admission: RE | Disposition: A | Discharge status: Home / Self Care | Payer: Self-pay | Source: Ambulatory Visit | Attending: Surgery

## 2016-04-15 ENCOUNTER — Ambulatory Visit (HOSPITAL_BASED_OUTPATIENT_CLINIC_OR_DEPARTMENT_OTHER)
Admission: RE | Admit: 2016-04-15 | Discharge: 2016-04-15 | Disposition: A | Discharge status: Home / Self Care | Payer: Medicare Other | Source: Ambulatory Visit | Attending: Surgery | Admitting: Surgery

## 2016-04-15 ENCOUNTER — Encounter (HOSPITAL_BASED_OUTPATIENT_CLINIC_OR_DEPARTMENT_OTHER): Payer: Self-pay | Admitting: Certified Registered"

## 2016-04-15 DIAGNOSIS — D0512 Intraductal carcinoma in situ of left breast: Secondary | ICD-10-CM | POA: Insufficient documentation

## 2016-04-15 DIAGNOSIS — N6012 Diffuse cystic mastopathy of left breast: Secondary | ICD-10-CM | POA: Diagnosis not present

## 2016-04-15 DIAGNOSIS — I10 Essential (primary) hypertension: Secondary | ICD-10-CM | POA: Diagnosis not present

## 2016-04-15 DIAGNOSIS — D242 Benign neoplasm of left breast: Secondary | ICD-10-CM | POA: Diagnosis not present

## 2016-04-15 DIAGNOSIS — E7800 Pure hypercholesterolemia, unspecified: Secondary | ICD-10-CM | POA: Insufficient documentation

## 2016-04-15 DIAGNOSIS — M1990 Unspecified osteoarthritis, unspecified site: Secondary | ICD-10-CM | POA: Insufficient documentation

## 2016-04-15 HISTORY — PX: BREAST LUMPECTOMY WITH RADIOACTIVE SEED LOCALIZATION: SHX6424

## 2016-04-15 SURGERY — BREAST LUMPECTOMY WITH RADIOACTIVE SEED LOCALIZATION
Anesthesia: General | Site: Breast | Laterality: Left

## 2016-04-15 MED ORDER — OXYCODONE HCL 5 MG/5ML PO SOLN: 5.0000 mg | Freq: Once | ORAL | Status: DC | PRN

## 2016-04-15 MED ORDER — OXYCODONE HCL 5 MG PO TABS: 5.0000 mg | ORAL_TABLET | Freq: Once | ORAL | Status: DC | PRN

## 2016-04-15 MED ORDER — PROPOFOL 10 MG/ML IV BOLUS
INTRAVENOUS | Status: DC | PRN
  Administered 2016-04-15: 150 mg via INTRAVENOUS

## 2016-04-15 MED ORDER — FENTANYL CITRATE (PF) 100 MCG/2ML IJ SOLN
50.0000 ug | INTRAMUSCULAR | Status: DC | PRN
  Administered 2016-04-15: 25 ug via INTRAVENOUS
  Administered 2016-04-15: 50 ug via INTRAVENOUS

## 2016-04-15 MED ORDER — LACTATED RINGERS IV SOLN: INTRAVENOUS | Status: DC

## 2016-04-15 MED ORDER — SCOPOLAMINE 1 MG/3DAYS TD PT72: 1.0000 | MEDICATED_PATCH | Freq: Once | TRANSDERMAL | Status: DC | PRN

## 2016-04-15 MED ORDER — PROMETHAZINE HCL 25 MG/ML IJ SOLN
6.2500 mg | INTRAMUSCULAR | Status: DC | PRN
Start: 2016-04-15 — End: 2016-04-15

## 2016-04-15 MED ORDER — DEXAMETHASONE SODIUM PHOSPHATE 4 MG/ML IJ SOLN
INTRAMUSCULAR | Status: DC | PRN
  Administered 2016-04-15: 10 mg via INTRAVENOUS

## 2016-04-15 MED ORDER — EPHEDRINE SULFATE 50 MG/ML IJ SOLN
INTRAMUSCULAR | Status: AC
  Filled 2016-04-15: qty 1

## 2016-04-15 MED ORDER — DEXTROSE 5 % IV SOLN
3.0000 g | INTRAVENOUS | Status: AC
  Administered 2016-04-15: 2 g via INTRAVENOUS

## 2016-04-15 MED ORDER — GLYCOPYRROLATE 0.2 MG/ML IJ SOLN: 0.2000 mg | Freq: Once | INTRAMUSCULAR | Status: DC | PRN

## 2016-04-15 MED ORDER — CHLORHEXIDINE GLUCONATE 4 % EX LIQD: 1.0000 "application " | Freq: Once | CUTANEOUS | Status: DC

## 2016-04-15 MED ORDER — ONDANSETRON HCL 4 MG/2ML IJ SOLN
INTRAMUSCULAR | Status: DC | PRN
  Administered 2016-04-15: 4 mg via INTRAVENOUS

## 2016-04-15 MED ORDER — FENTANYL CITRATE (PF) 100 MCG/2ML IJ SOLN
INTRAMUSCULAR | Status: AC
  Filled 2016-04-15: qty 2

## 2016-04-15 MED ORDER — LACTATED RINGERS IV SOLN
INTRAVENOUS | Status: DC
  Administered 2016-04-15 (×2): via INTRAVENOUS

## 2016-04-15 MED ORDER — ONDANSETRON HCL 4 MG/2ML IJ SOLN
INTRAMUSCULAR | Status: AC
  Filled 2016-04-15: qty 2

## 2016-04-15 MED ORDER — FENTANYL CITRATE (PF) 100 MCG/2ML IJ SOLN
25.0000 ug | INTRAMUSCULAR | Status: DC | PRN
  Administered 2016-04-15 (×2): 50 ug via INTRAVENOUS

## 2016-04-15 MED ORDER — CEFAZOLIN SODIUM 1-5 GM-% IV SOLN
INTRAVENOUS | Status: AC
  Filled 2016-04-15: qty 50

## 2016-04-15 MED ORDER — LIDOCAINE HCL (CARDIAC) 20 MG/ML IV SOLN
INTRAVENOUS | Status: DC | PRN
  Administered 2016-04-15: 60 mg via INTRAVENOUS

## 2016-04-15 MED ORDER — MIDAZOLAM HCL 2 MG/2ML IJ SOLN: 1.0000 mg | INTRAMUSCULAR | Status: DC | PRN

## 2016-04-15 MED ORDER — DEXAMETHASONE SODIUM PHOSPHATE 10 MG/ML IJ SOLN
INTRAMUSCULAR | Status: AC
  Filled 2016-04-15: qty 1

## 2016-04-15 MED ORDER — EPHEDRINE SULFATE 50 MG/ML IJ SOLN
INTRAMUSCULAR | Status: DC | PRN
  Administered 2016-04-15: 5 mg via INTRAVENOUS
  Administered 2016-04-15 (×2): 10 mg via INTRAVENOUS

## 2016-04-15 MED ORDER — ATROPINE SULFATE 0.4 MG/ML IJ SOLN
INTRAMUSCULAR | Status: AC
  Filled 2016-04-15: qty 1

## 2016-04-15 MED ORDER — BUPIVACAINE-EPINEPHRINE (PF) 0.25% -1:200000 IJ SOLN
INTRAMUSCULAR | Status: DC | PRN
  Administered 2016-04-15: 10 mL

## 2016-04-15 MED ORDER — PROPOFOL 500 MG/50ML IV EMUL
INTRAVENOUS | Status: AC
  Filled 2016-04-15: qty 50

## 2016-04-15 MED ORDER — LIDOCAINE HCL (CARDIAC) 20 MG/ML IV SOLN
INTRAVENOUS | Status: AC
  Filled 2016-04-15: qty 5

## 2016-04-15 MED ORDER — BUPIVACAINE-EPINEPHRINE (PF) 0.25% -1:200000 IJ SOLN
INTRAMUSCULAR | Status: AC
  Filled 2016-04-15: qty 90

## 2016-04-15 MED ORDER — HYDROCODONE-ACETAMINOPHEN 5-325 MG PO TABS: 1.0000 | ORAL_TABLET | Freq: Four times a day (QID) | ORAL | Status: DC | PRN

## 2016-04-15 SURGICAL SUPPLY — 47 items
APPLIER CLIP 9.375 MED OPEN (MISCELLANEOUS)
APR CLP MED 9.3 20 MLT OPN (MISCELLANEOUS)
BINDER BREAST LRG (GAUZE/BANDAGES/DRESSINGS) IMPLANT
BINDER BREAST XLRG (GAUZE/BANDAGES/DRESSINGS) IMPLANT
BLADE SURG 15 STRL LF DISP TIS (BLADE) ×1 IMPLANT
BLADE SURG 15 STRL SS (BLADE) ×3
CANISTER SUC SOCK COL 7IN (MISCELLANEOUS) IMPLANT
CANISTER SUCT 1200ML W/VALVE (MISCELLANEOUS) IMPLANT
CHLORAPREP W/TINT 26ML (MISCELLANEOUS) ×3 IMPLANT
CLIP APPLIE 9.375 MED OPEN (MISCELLANEOUS) IMPLANT
COVER BACK TABLE 60X90IN (DRAPES) ×3 IMPLANT
COVER MAYO STAND STRL (DRAPES) ×3 IMPLANT
COVER PROBE W GEL 5X96 (DRAPES) ×3 IMPLANT
DECANTER SPIKE VIAL GLASS SM (MISCELLANEOUS) IMPLANT
DEVICE DUBIN W/COMP PLATE 8390 (MISCELLANEOUS) ×3 IMPLANT
DRAPE LAPAROTOMY 100X72 PEDS (DRAPES) ×3 IMPLANT
DRAPE UTILITY XL STRL (DRAPES) ×3 IMPLANT
ELECT COATED BLADE 2.86 ST (ELECTRODE) ×3 IMPLANT
ELECT REM PT RETURN 9FT ADLT (ELECTROSURGICAL) ×3
ELECTRODE REM PT RTRN 9FT ADLT (ELECTROSURGICAL) ×1 IMPLANT
GLOVE BIOGEL PI IND STRL 7.5 (GLOVE) IMPLANT
GLOVE BIOGEL PI IND STRL 8 (GLOVE) ×1 IMPLANT
GLOVE BIOGEL PI INDICATOR 7.5 (GLOVE) ×4
GLOVE BIOGEL PI INDICATOR 8 (GLOVE) ×2
GLOVE ECLIPSE 7.0 STRL STRAW (GLOVE) ×2 IMPLANT
GLOVE ECLIPSE 8.0 STRL XLNG CF (GLOVE) ×3 IMPLANT
GOWN STRL REUS W/ TWL LRG LVL3 (GOWN DISPOSABLE) ×2 IMPLANT
GOWN STRL REUS W/TWL LRG LVL3 (GOWN DISPOSABLE) ×6
HEMOSTAT SNOW SURGICEL 2X4 (HEMOSTASIS) IMPLANT
KIT MARKER MARGIN INK (KITS) ×3 IMPLANT
LIQUID BAND (GAUZE/BANDAGES/DRESSINGS) ×3 IMPLANT
NDL HYPO 25X1 1.5 SAFETY (NEEDLE) ×1 IMPLANT
NEEDLE HYPO 25X1 1.5 SAFETY (NEEDLE) ×3 IMPLANT
NS IRRIG 1000ML POUR BTL (IV SOLUTION) ×3 IMPLANT
PACK BASIN DAY SURGERY FS (CUSTOM PROCEDURE TRAY) ×3 IMPLANT
PENCIL BUTTON HOLSTER BLD 10FT (ELECTRODE) ×3 IMPLANT
SLEEVE SCD COMPRESS KNEE MED (MISCELLANEOUS) ×3 IMPLANT
SPONGE LAP 4X18 X RAY DECT (DISPOSABLE) ×3 IMPLANT
SUT MNCRL AB 4-0 PS2 18 (SUTURE) ×3 IMPLANT
SUT SILK 2 0 SH (SUTURE) IMPLANT
SUT VICRYL 3-0 CR8 SH (SUTURE) ×3 IMPLANT
SYR CONTROL 10ML LL (SYRINGE) ×3 IMPLANT
TOWEL OR 17X24 6PK STRL BLUE (TOWEL DISPOSABLE) ×3 IMPLANT
TOWEL OR NON WOVEN STRL DISP B (DISPOSABLE) ×3 IMPLANT
TUBE CONNECTING 20'X1/4 (TUBING)
TUBE CONNECTING 20X1/4 (TUBING) IMPLANT
YANKAUER SUCT BULB TIP NO VENT (SUCTIONS) IMPLANT

## 2016-04-15 NOTE — Op Note (Signed)
Preoperative diagnosis: Left breast ductal carcinoma in situ  Postoperative diagnosis: Same  Procedure: Left breast seed localized partial mastectomy  Surgeon: Erroll Luna M.D.  Anesthesia LMA with 0.25% Sensorcaine local  EBL: Minimal  Specimen: Left breast tissue with seed and clip to pathology  Drains: None  Indications for procedure: The patient is a 75 year old female with a new mammographic abnormality intermedial left breast. Core biopsy showed CDIS. She was seen in counseling about treatment options to include breast conservation versus mastectomy and reconstruction. Pros and cons were discussed. It was a small area and the patient chose breast conservation.The procedure has been discussed with the patient. Alternatives to surgery have been discussed with the patient.  Risks of surgery include bleeding,  Infection,  Seroma formation, death,  and the need for further surgery.   The patient understands and wishes to proceed.  Description of procedure: The patient was met in the holding area and questions are answered. The left breast was marked as the correct side and neoprobe used to verify proper seed location. The patient was taken back to the operating room and placed supine on the OR table. After induction of LMA anesthesia, the left breast was prepped and draped in sterile fashion and timeout was done. The neoprobe was used to localize the hotspot medial left breast. Transverse incision was made after infiltration of local anesthesia in the medial left breast. Dissection was carried down to the breast tissue to excise all tissue around the clip and seed. An additional medial margin was taken included the posterior pectoralis fascia. Hemostasis was achieved. Radiograph revealed the seating clip to be in the specimen. Hemostasis achieved with cautery and the wound closed with 3-0 Vicryl and 4-0 Monocryl. Of note the cavity was clipped. Liquid adhesive applied as well as a breast  binder. All final counts found to be correct. Patient was awoke extubated taken to recovery in satisfactory condition.

## 2016-04-15 NOTE — Anesthesia Procedure Notes (Signed)
Procedure Name: LMA Insertion Date/Time: 04/15/2016 8:34 AM Performed by: Baxter Flattery Pre-anesthesia Checklist: Patient identified, Emergency Drugs available, Suction available and Patient being monitored Patient Re-evaluated:Patient Re-evaluated prior to inductionOxygen Delivery Method: Circle System Utilized Preoxygenation: Pre-oxygenation with 100% oxygen Intubation Type: IV induction Ventilation: Mask ventilation without difficulty LMA: LMA inserted LMA Size: 4.0 Number of attempts: 1 Airway Equipment and Method: Bite block Placement Confirmation: positive ETCO2 and breath sounds checked- equal and bilateral Tube secured with: Tape Dental Injury: Teeth and Oropharynx as per pre-operative assessment

## 2016-04-15 NOTE — Anesthesia Postprocedure Evaluation (Signed)
Anesthesia Post Note  Patient: Cindy Whitaker  Procedure(s) Performed: Procedure(s) (LRB): BREAST LUMPECTOMY WITH RADIOACTIVE SEED LOCALIZATION (Left)  Patient location during evaluation: PACU Anesthesia Type: General Level of consciousness: awake and alert Pain management: pain level controlled Vital Signs Assessment: post-procedure vital signs reviewed and stable Respiratory status: spontaneous breathing, nonlabored ventilation, respiratory function stable and patient connected to nasal cannula oxygen Cardiovascular status: blood pressure returned to baseline and stable Postop Assessment: no signs of nausea or vomiting Anesthetic complications: no    Last Vitals:  Filed Vitals:   04/15/16 1000 04/15/16 1024  BP:  159/86  Pulse: 59 63  Temp:  36.4 C  Resp: 9 16    Last Pain:  Filed Vitals:   04/15/16 1028  PainSc: Ashe Cannan Beeck

## 2016-04-15 NOTE — Transfer of Care (Signed)
Immediate Anesthesia Transfer of Care Note  Patient: Cindy Whitaker  Procedure(s) Performed: Procedure(s): BREAST LUMPECTOMY WITH RADIOACTIVE SEED LOCALIZATION (Left)  Patient Location: PACU  Anesthesia Type:General  Level of Consciousness: awake, alert  and patient cooperative  Airway & Oxygen Therapy: Patient Spontanous Breathing and Patient connected to face mask oxygen  Post-op Assessment: Report given to RN, Post -op Vital signs reviewed and stable and Patient moving all extremities  Post vital signs: Reviewed and stable  Last Vitals:  Filed Vitals:   04/15/16 0918 04/15/16 0919  BP: 140/71   Pulse: 72 74  Temp:    Resp:  19    Complications: No apparent anesthesia complications

## 2016-04-15 NOTE — H&P (View-Only) (Signed)
Cindy Whitaker 03/18/2016 8:00 AM Location: [facility] Patient #: [id number] DOB: 02-May-1941 Undefined / Language: [name] / Race: Undefined Female  History of Present Illness Cindy Whitaker Orth MD; 03/18/2016 2:49 PM) Patient words: Pt sent at the request of Dr Isidore Moos for left breast mass detected on mammogram. Bx shows 9 mm DCIS lo grade withinpapilloma. Pt has no breast pain, mass or discharge. No family history of breast cancer.  The patient is a 75 year old female.   Other Problems [name], RN; 03/18/2016 8:00 AM) Arthritis Back Pain Bladder Problems High blood pressure Hypercholesterolemia  Past Surgical History [name], RN; 03/18/2016 8:00 AM) Foot Surgery Right.  Diagnostic Studies History [name], RN; 03/18/2016 8:00 AM) Colonoscopy within last year Mammogram within last year Pap Smear 1-5 years ago  Medication History [name], RN; 03/18/2016 8:00 AM) No Current Medications Medications Reconciled  Social History [name], RN; 03/18/2016 8:00 AM) Caffeine use Coffee, Tea. No alcohol use No drug use Tobacco use Never smoker.  Family History [name], RN; 03/18/2016 8:00 AM) Hypertension Brother, Sister. Thyroid problems Sister.  Pregnancy / Birth History [name], RN; 03/18/2016 8:00 AM) Age at menarche 42 years. Age of menopause 56-50 Gravida 84 Maternal age 49-25 Para 3     Review of Systems [name] RN; 03/18/2016 8:00 AM) General Not Present- Appetite Loss, Chills, Fatigue, Fever, Night Sweats, Weight Gain and Weight Loss. Skin Not Present- Change in Wart/Mole, Dryness, Hives, Jaundice, New Lesions, Non-Healing Wounds, Rash and Ulcer. HEENT Present- Seasonal Allergies and Wears glasses/contact lenses. Not Present- Earache, Hearing Loss, Hoarseness, Nose Bleed, Oral Ulcers, Ringing in the Ears, Sinus Pain, Sore Throat, Visual Disturbances and Yellow Eyes. Respiratory Present- Snoring. Not  Present- Bloody sputum, Chronic Cough, Difficulty Breathing and Wheezing. Breast Not Present- Breast Mass, Breast Pain, Nipple Discharge and Skin Changes. Cardiovascular Not Present- Chest Pain, Difficulty Breathing Lying Down, Leg Cramps, Palpitations, Rapid Heart Rate, Shortness of Breath and Swelling of Extremities. Gastrointestinal Not Present- Abdominal Pain, Bloating, Bloody Stool, Change in Bowel Habits, Chronic diarrhea, Constipation, Difficulty Swallowing, Excessive gas, Gets full quickly at meals, Hemorrhoids, Indigestion, Nausea, Rectal Pain and Vomiting. Female Genitourinary Present- Nocturia. Not Present- Frequency, Painful Urination, Pelvic Pain and Urgency. Musculoskeletal Present- Back Pain. Not Present- Joint Pain, Joint Stiffness, Muscle Pain, Muscle Weakness and Swelling of Extremities. Neurological Not Present- Decreased Memory, Fainting, Headaches, Numbness, Seizures, Tingling, Tremor, Trouble walking and Weakness. Psychiatric Not Present- Anxiety, Bipolar, Change in Sleep Pattern, Depression, Fearful and Frequent crying. Endocrine Not Present- Cold Intolerance, Excessive Hunger, Hair Changes, Heat Intolerance, Hot flashes and New Diabetes. Hematology Not Present- Easy Bruising, Excessive bleeding, Gland problems, HIV and Persistent Infections.   Physical Exam ([name]; 03/18/2016 2:50 PM)  General Mental Status-Alert. General Appearance-Consistent with stated age. Hydration-Well hydrated. Voice-Normal.  Head and Neck Head-normocephalic, atraumatic with no lesions or palpable masses. Trachea-midline. Thyroid Gland Characteristics - normal size and consistency.  Eye Eyeball - Bilateral-Extraocular movements intact. Sclera/Conjunctiva - Bilateral-No scleral icterus.  Chest and Lung Exam Chest and lung exam reveals -quiet, even and easy respiratory effort with no use of accessory muscles and on auscultation, normal breath sounds, no  adventitious sounds and normal vocal resonance. Inspection Chest Wall - Normal. Back - normal.  Breast Breast - Left-Symmetric, Non Tender, No Biopsy scars, no Dimpling, No Inflammation, No Lumpectomy scars, No Mastectomy scars, No Peau d' Orange. Breast - Right-Symmetric, Non Tender, No Biopsy scars, no Dimpling, No Inflammation, No Lumpectomy scars, No Mastectomy  scars, No Peau d' Orange. Breast Lump-No Palpable Breast Mass. Note: small medial left breast hematoma  Cardiovascular Cardiovascular examination reveals -normal heart sounds, regular rate and rhythm with no murmurs and normal pedal pulses bilaterally.  Abdomen Inspection Inspection of the abdomen reveals - No Hernias. Skin - Scar - no surgical scars. Palpation/Percussion Palpation and Percussion of the abdomen reveal - Soft, Non Tender, No Rebound tenderness, No Rigidity (guarding) and No hepatosplenomegaly. Auscultation Auscultation of the abdomen reveals - Bowel sounds normal.  Neurologic Neurologic evaluation reveals -alert and oriented x 3 with no impairment of recent or remote memory. Mental Status-Normal.  Musculoskeletal Normal Exam - Left-Upper Extremity Strength Normal and Lower Extremity Strength Normal. Normal Exam - Right-Upper Extremity Strength Normal and Lower Extremity Strength Normal.  Lymphatic Head & Neck  General Head & Neck Lymphatics: Bilateral - Description - Normal. Axillary  General Axillary Region: Bilateral - Description - Normal. Tenderness - Non Tender. Femoral & Inguinal - Did not examine.    Assessment & Plan ([name]; 03/18/2016 2:51 PM)  DCIS (DUCTAL CARCINOMA IN SITU), LEFT (D05.12) Impression: discussed breast conservation vs mastectomy.  Pt has opted for left breast lumpectomy  Risk of lumpectomy include bleeding, infection, seroma, more surgery, use of seed/wire, wound care, cosmetic deformity and the need for other treatments, death , blood  clots, death. Pt agrees to proceed.  Current Plans Pt Education - [facility] Breast Biopsy HCI: discussed with patient and provided information. We discussed the staging and pathophysiology of breast cancer. We discussed all of the different options for treatment for breast cancer including surgery, chemotherapy, radiation therapy, Herceptin, and antiestrogen therapy. We discussed a sentinel lymph node biopsy as she does not appear to having lymph node involvement right now. We discussed the performance of that with injection of radioactive tracer and blue dye. We discussed that she would have an incision underneath her axillary hairline. We discussed that there is a bout a 10-20% chance of having a positive node with a sentinel lymph node biopsy and we will await the permanent pathology to make any other first further decisions in terms of her treatment. One of these options might be to return to the operating room to perform an axillary lymph node dissection. We discussed about a 1-2% risk lifetime of chronic shoulder pain as well as lymphedema associated with a sentinel lymph node biopsy. We discussed the options for treatment of the breast cancer which included lumpectomy versus a mastectomy. We discussed the performance of the lumpectomy with a wire placement. We discussed a 10-20% chance of a positive margin requiring reexcision in the operating room. We also discussed that she may need radiation therapy or antiestrogen therapy or both if she undergoes lumpectomy. We discussed the mastectomy and the postoperative care for that as well. We discussed that there is no difference in her survival whether she undergoes lumpectomy with radiation therapy or antiestrogen therapy versus a mastectomy. There is a slight difference in the local recurrence rate being 3-5% with lumpectomy and about 1% with a mastectomy. We discussed the risks of operation including bleeding, infection, possible reoperation. She understands  her further therapy will be based on what her stages at the time of her operation.  Pt Education - [facility] Breast Cancer Information Given - [facility] "Breast Journey" Package You are being scheduled for surgery - Our schedulers will call you.  You should hear from our office's scheduling department within 5 working days about the location, date, and time of surgery.  We try to make accommodations for patient's preferences in scheduling surgery, but sometimes the OR schedule or the surgeon's schedule prevents [place] from making those accommodations.  If you have not heard from our office [phone]) in 5 working days, call the office and ask for your surgeon's nurse.  If you have other questions about your diagnosis, plan, or surgery, call the office and ask for your surgeon's nurse.

## 2016-04-15 NOTE — Interval H&P Note (Signed)
History and Physical Interval Note:  04/15/2016 8:03 AM  Cindy Whitaker  has presented today for surgery, with the diagnosis of LEFT BREAST DCIS  The various methods of treatment have been discussed with the patient and family. After consideration of risks, benefits and other options for treatment, the patient has consented to  Procedure(s): BREAST LUMPECTOMY WITH RADIOACTIVE SEED LOCALIZATION (Left) as a surgical intervention .  The patient's history has been reviewed, patient examined, no change in status, stable for surgery.  I have reviewed the patient's chart and labs.  Questions were answered to the patient's satisfaction.     [name]

## 2016-04-15 NOTE — Discharge Instructions (Signed)
[facility] °Office Phone Number [phone] ° °BREAST BIOPSY/ PARTIAL MASTECTOMY: POST OP INSTRUCTIONS ° °Always review your discharge instruction sheet given to you by the facility where your surgery was performed. ° °IF YOU HAVE DISABILITY OR FAMILY LEAVE FORMS, YOU MUST BRING THEM TO THE OFFICE FOR PROCESSING.  DO NOT GIVE THEM TO YOUR DOCTOR. ° °1. A prescription for pain medication may be given to you upon discharge.  Take your pain medication as prescribed, if needed.  If narcotic pain medicine is not needed, then you may take acetaminophen (Tylenol) or ibuprofen (Advil) as needed. °2. Take your usually prescribed medications unless otherwise directed °3. If you need a refill on your pain medication, please contact your pharmacy.  They will contact our office to request authorization.  Prescriptions will not be filled after 5pm or on week-ends. °4. You should eat very light the first 24 hours after surgery, such as soup, crackers, pudding, etc.  Resume your normal diet the day after surgery. °5. Most patients will experience some swelling and bruising in the breast.  Ice packs and a good support bra will help.  Swelling and bruising can take several days to resolve.  °6. It is common to experience some constipation if taking pain medication after surgery.  Increasing fluid intake and taking a stool softener will usually help or prevent this problem from occurring.  A mild laxative (Milk of Magnesia or Miralax) should be taken according to package directions if there are no bowel movements after 48 hours. °7. Unless discharge instructions indicate otherwise, you may remove your bandages 24-48 hours after surgery, and you may shower at that time.  You may have steri-strips (small skin tapes) in place directly over the incision.  These strips should be left on the skin for 7-10 days.  If your surgeon used skin glue on the incision, you may shower in 24 hours.  The glue will flake off over the  next 2-3 weeks.  Any sutures or staples will be removed at the office during your follow-up visit. °8. ACTIVITIES:  You may resume regular daily activities (gradually increasing) beginning the next day.  Wearing a good support bra or sports bra minimizes pain and swelling.  You may have sexual intercourse when it is comfortable. °a. You may drive when you no longer are taking prescription pain medication, you can comfortably wear a seatbelt, and you can safely maneuver your car and apply brakes. °b. RETURN TO WORK:  ______________________________________________________________________________________ °9. You should see your doctor in the office for a follow-up appointment approximately two weeks after your surgery.  Your doctor’s nurse will typically make your follow-up appointment when she calls you with your pathology report.  Expect your pathology report 2-3 business days after your surgery.  You may call to check if you do not hear from us after three days. °10. OTHER INSTRUCTIONS: _______________________________________________________________________________________________ _____________________________________________________________________________________________________________________________________ °_____________________________________________________________________________________________________________________________________ °_____________________________________________________________________________________________________________________________________ ° °WHEN TO CALL YOUR DOCTOR: °1. Fever over 101.0 °2. Nausea and/or vomiting. °3. Extreme swelling or bruising. °4. Continued bleeding from incision. °5. Increased pain, redness, or drainage from the incision. ° °The clinic staff is available to answer your questions during regular business hours.  Please don’t hesitate to call and ask to speak to one of the nurses for clinical concerns.  If you have a medical emergency, go to the nearest  emergency room or call 911.  A surgeon from [facility] is always on call at the hospital. ° °For further questions, please visit [email or web address]  ° ° ° °  Post Anesthesia Home Care Instructions ° °Activity: °Get plenty of rest for the remainder of the day. A responsible adult should stay with you for 24 hours following the procedure.  °For the next 24 hours, DO NOT: °-Drive a car °-Operate machinery °-Drink alcoholic beverages °-Take any medication unless instructed by your physician °-Make any legal decisions or sign important papers. ° °Meals: °Start with liquid foods such as gelatin or soup. Progress to regular foods as tolerated. Avoid greasy, spicy, heavy foods. If nausea and/or vomiting occur, drink only clear liquids until the nausea and/or vomiting subsides. Call your physician if vomiting continues. ° °Special Instructions/Symptoms: °Your throat may feel dry or sore from the anesthesia or the breathing tube placed in your throat during surgery. If this causes discomfort, gargle with warm salt water. The discomfort should disappear within 24 hours. ° °If you had a scopolamine patch placed behind your ear for the management of post- operative nausea and/or vomiting: ° °1. The medication in the patch is effective for 72 hours, after which it should be removed.  Wrap patch in a tissue and discard in the trash. Wash hands thoroughly with soap and water. °2. You may remove the patch earlier than 72 hours if you experience unpleasant side effects which may include dry mouth, dizziness or visual disturbances. °3. Avoid touching the patch. Wash your hands with soap and water after contact with the patch. °  ° °

## 2016-04-16 ENCOUNTER — Encounter (HOSPITAL_BASED_OUTPATIENT_CLINIC_OR_DEPARTMENT_OTHER): Payer: Self-pay | Admitting: Surgery

## 2016-04-29 ENCOUNTER — Encounter: Payer: Self-pay | Admitting: Oncology

## 2016-04-30 NOTE — Progress Notes (Signed)
Location of Breast Cancer: Left Breast  Histology per Pathology Report:  04/15/16 Diagnosis 1. Breast, lumpectomy, Left - LOW GRADE DUCTAL CARCINOMA IN SITU INVOLVING AN INTRADUCTAL PAPILLOMA. - LOW GRADE DUCTAL CARCINOMA IN SITU IS FOCALLY 0.3 CM FROM ANTERIOR MARGIN. - OTHER MARGINS ARE NEGATIVE. - SEE ONCOLOGY TEMPLATE. ADDITIONAL FINDINGS: - COMPLEX SCLEROSING LESION WITH FIBROCYSTIC CHANGES AND FOCAL ATYPICAL DUCTAL HYPERPLASIA. - SEPARATE AREA OF FIBROCYSTIC CHANGES WITH APOCRINE METAPLASIA. 2. Breast, excision, Left additional medial margin - FIBROADENOMATOID CHANGES WITH FIBROCYSTIC CHANGES. - NO ATYPIA, HYPERPLASIA, OR MALIGNANCY IDENTIFIED.  Receptor Status: ER(100%), PR (100%),  Did patient present with symptoms or was this found on screening mammography?: It was found on a screening mammogram.  Past/Anticipated interventions by surgeon, if any: 04/15/16 Dr. Brantley Stage  performed Left breast seed localized partial mastectomy. She saw Dr. Brantley Stage 05/01/16, and he felt like she was healing well.   Past/Anticipated interventions by medical oncology, if any: Dr. Jana Hakim saw in the [facility] 03/18/16. He recommends antiestrogen therapy. Her next appointment is June 20th.  Lymphedema issues, if any:   Pain issues, if any:  She reports pain a 5/10 over her lumpectomy site, which feels full. She is taking ibuprofen for this pain.  SAFETY ISSUES:  Prior radiation? No  Pacemaker/ICD? No  Possible current pregnancy? No  Is the patient on methotrexate? No  Current Complaints / other details:    BP 155/68 mmHg  Pulse 71  Temp(Src) 98 F (36.7 C)  Ht 5\' 2"  (1.575 m)  Wt 177 lb 11.2 oz (80.604 kg)  BMI 32.49 kg/m2  SpO2 100%    Lamarkus Nebel, Stephani Police, RN 04/30/2016,9:19 AM

## 2016-05-04 ENCOUNTER — Ambulatory Visit
Admission: RE | Admit: 2016-05-04 | Discharge: 2016-05-04 | Disposition: A | Discharge status: Home / Self Care | Payer: Medicare Other | Source: Ambulatory Visit | Attending: Radiation Oncology | Admitting: Radiation Oncology

## 2016-05-04 ENCOUNTER — Other Ambulatory Visit: Payer: Self-pay | Admitting: Oncology

## 2016-05-04 ENCOUNTER — Other Ambulatory Visit: Payer: Self-pay | Admitting: *Deleted

## 2016-05-04 VITALS — BP 155/68 | HR 71 | Temp 98.0°F | Ht 62.0 in | Wt 177.7 lb

## 2016-05-04 DIAGNOSIS — Z170 Estrogen receptor positive status [ER+]: Secondary | ICD-10-CM | POA: Diagnosis not present

## 2016-05-04 DIAGNOSIS — C50312 Malignant neoplasm of lower-inner quadrant of left female breast: Secondary | ICD-10-CM

## 2016-05-04 DIAGNOSIS — C50212 Malignant neoplasm of upper-inner quadrant of left female breast: Secondary | ICD-10-CM | POA: Diagnosis present

## 2016-05-04 DIAGNOSIS — Z510 Encounter for antineoplastic radiation therapy: Secondary | ICD-10-CM | POA: Insufficient documentation

## 2016-05-04 NOTE — Addendum Note (Signed)
Encounter addended by: Ernst Spell, RN on: 05/04/2016 10:50 AM<BR>     Documentation filed: Charges VN

## 2016-05-04 NOTE — Progress Notes (Signed)
Radiation Oncology         [phone] ________________________________  Name: Cindy Whitaker MRN: [id number]  Date: 05/04/2016  DOB: [birth year]  Follow-Up Visit Note  Outpatient  CC: Haywood Pao, MD  Magrinat, Virgie Dad, MD  Diagnosis:      ICD-9-CM ICD-10-CM   1. 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     Since consultation, she underwent left lumpectomy on 04-15-16 revealing a 82m tumor with clear margins to in situ and invasive disease.   No SLN biopsy.  She is recovering well.  Followup with med/onc in June.           She saw Dr. CBrantley Stage5/5/17, and he felt like she was healing well. Taking ibuprofen for breast pain.  Denies lymphedema.   ALLERGIES:  has No Known Allergies.  Meds: Current Outpatient Prescriptions  Medication Sig Dispense Refill  . amLODipine (NORVASC) 5 MG tablet TK 1 T PO QD  5  . Ascorbic Acid (VITAMIN C) 100 MG tablet Take 600 mg by mouth daily.    .Marland Kitchenatorvastatin (LIPITOR) 10 MG tablet Take 10 mg by mouth daily.    . Calcium Carbonate (CALCIUM 600 HIGH POTENCY PO) Take by mouth.    . Cholecalciferol (VITAMIN D3) 5000 units CAPS Take by mouth.    . dorzolamide-timolol (COSOPT) 22.3-6.8 MG/ML ophthalmic solution Place 1 drop into the left eye 2 (two) times daily.    . Flaxseed, Linseed, (FLAXSEED OIL) 1000 MG CAPS Take by mouth.    .Marland Kitchenibuprofen (ADVIL,MOTRIN) 200 MG tablet Take 200 mg by mouth every 6 (six) hours as needed.    . latanoprost (XALATAN) 0.005 % ophthalmic solution Place 1 drop into both eyes at bedtime.    .Marland Kitchenlosartan (COZAAR) 50 MG tablet Take 50 mg by mouth daily.    . Omega-3 Fatty Acids (FISH OIL) 1000 MG CPDR Take by mouth.    .Marland KitchenHYDROcodone-acetaminophen (NORCO) 5-325 MG tablet Take 1 tablet by mouth every 6 (six) hours as  needed for moderate pain. (Patient not taking: Reported on 05/04/2016) 30 tablet 0   No current facility-administered medications for this encounter.    Physical Findings:  height is _0  (1.575 m) and weight is 177 lb 11.2 oz (80.604 kg). Her temperature is 98 F (36.7 C). Her blood pressure is 155/68 and her pulse is 71. Her oxygen saturation is 100%. .     General: Alert and oriented, in no acute distress HEENT: Head is normocephalic.   Oropharynx is clear. Neck: Neck is supple, no palpable cervical or supraclavicular lymphadenopathy. Heart: Regular in rate and rhythm with no murmurs, rubs, or gallops. Chest: Clear to auscultation bilaterally, with no rhonchi, wheezes, or rales. Extremities: No cyanosis or edema. Lymphatics: see Neck Exam Neurologic: No obvious focalities. Speech is fluent.  Psychiatric: Judgment and insight are intact. Affect is appropriate. Breast exam reveals well healed LIQ left breast lumpectomy scar ECOG 0  Lab Findings: Lab Results  Component Value Date   WBC 9.5 03/18/2016   HGB 12.2 03/18/2016   HCT 34.2* 03/18/2016   MCV 86.1 03/18/2016   PLT 242 03/18/2016       Radiographic Findings: No results found.  Impression/Plan: Stage I left breast cancer  We discussed adjuvant radiotherapy today.    For the patient's early stage  favorable risk breast cancer, we had a thorough discussion about her options for adjuvant therapy. One option would be antiestrogen therapy as discussed with medical oncology. She would take a pill for approximately 5 years. The alternative option (but less standard) would be radiotherapy to the breast. The most aggressive option would be to pursue both modalities.  Of note, I discussed the data from the [employer] al trial in the [place] [facility]. She understands that tamoxifen compared to radiation plus tamoxifen demonstrated no survival benefit among the women in this study. The women were 65 years or older with  stage I estrogen receptor positive breast cancer. Based on this study, I told the patient that her overall life expectancy should not be affected by adding radiotherapy to antiestrogen medication. She understands that the main benefit of  adding radiotherapy to anti estrogen therapy would be a very small but measurable local control benefit (risk of local recurrence to be lowered from ~9% --> ~2% over a decade).  We discussed the fact that radiotherapy only provides a local control benefit while anti-estrogen pills provide systemic coverage. That being said, the risk of systemic failure is relatively low with her type of breast cancer.  We discussed the risks benefits and side effects of radiotherapy. She understands that the side effects would likely include some skin irritation and fatigue during the weeks of radiation. There is a risk of late effects which include but are not necessarily limited to cosmetic changes and rare heart or lung toxicity. I would anticipate delivering approximately 4 weeks of radiotherapy.  After a thorough discussion, the patient would like proceed with radiotherapy.  I think this is a good idea, given that she is a healthy woman for her age and she could live for decades. A consent form has been  signed and placed in her chart. Simulation to be scheduled for next week.  I will notify Dr. Jana Hakim of my plans. If the HER2 or Ki67 or ER/PR status analysis on her disease (to be released soon) give him pause to consider chemotherapy, we can delay her treatment planning. This is unlikely, however.  _____________________________________   Eppie Gibson, MD

## 2016-05-06 ENCOUNTER — Other Ambulatory Visit: Payer: Self-pay | Admitting: Oncology

## 2016-05-13 ENCOUNTER — Encounter: Payer: Self-pay | Admitting: Radiation Oncology

## 2016-05-13 ENCOUNTER — Ambulatory Visit
Admission: RE | Admit: 2016-05-13 | Discharge: 2016-05-13 | Disposition: A | Discharge status: Home / Self Care | Payer: Medicare Other | Source: Ambulatory Visit | Attending: Radiation Oncology | Admitting: Radiation Oncology

## 2016-05-13 DIAGNOSIS — Z510 Encounter for antineoplastic radiation therapy: Secondary | ICD-10-CM | POA: Diagnosis not present

## 2016-05-18 DIAGNOSIS — Z510 Encounter for antineoplastic radiation therapy: Secondary | ICD-10-CM | POA: Diagnosis not present

## 2016-05-20 ENCOUNTER — Ambulatory Visit
Admission: RE | Admit: 2016-05-20 | Discharge: 2016-05-20 | Disposition: A | Discharge status: Home / Self Care | Payer: Medicare Other | Source: Ambulatory Visit | Attending: Radiation Oncology | Admitting: Radiation Oncology

## 2016-05-20 DIAGNOSIS — Z510 Encounter for antineoplastic radiation therapy: Secondary | ICD-10-CM | POA: Diagnosis not present

## 2016-05-21 ENCOUNTER — Ambulatory Visit
Admission: RE | Admit: 2016-05-21 | Discharge: 2016-05-21 | Disposition: A | Discharge status: Home / Self Care | Payer: Medicare Other | Source: Ambulatory Visit | Attending: Radiation Oncology | Admitting: Radiation Oncology

## 2016-05-21 DIAGNOSIS — C50312 Malignant neoplasm of lower-inner quadrant of left female breast: Secondary | ICD-10-CM

## 2016-05-21 DIAGNOSIS — Z510 Encounter for antineoplastic radiation therapy: Secondary | ICD-10-CM | POA: Diagnosis not present

## 2016-05-21 MED ORDER — ALRA NON-METALLIC DEODORANT (RAD-ONC)
1.0000 "application " | Freq: Once | TOPICAL | Status: AC
  Administered 2016-05-21: 1 via TOPICAL

## 2016-05-21 MED ORDER — RADIAPLEXRX EX GEL
Freq: Once | CUTANEOUS | Status: AC
  Administered 2016-05-21: 15:00:00 via TOPICAL

## 2016-05-21 NOTE — Progress Notes (Signed)

## 2016-05-22 ENCOUNTER — Ambulatory Visit
Admission: RE | Admit: 2016-05-22 | Discharge: 2016-05-22 | Disposition: A | Discharge status: Home / Self Care | Payer: Medicare Other | Source: Ambulatory Visit | Attending: Radiation Oncology | Admitting: Radiation Oncology

## 2016-05-22 DIAGNOSIS — Z510 Encounter for antineoplastic radiation therapy: Secondary | ICD-10-CM | POA: Diagnosis not present

## 2016-05-26 ENCOUNTER — Ambulatory Visit
Admission: RE | Admit: 2016-05-26 | Discharge: 2016-05-26 | Disposition: A | Discharge status: Home / Self Care | Payer: Medicare Other | Source: Ambulatory Visit | Attending: Radiation Oncology | Admitting: Radiation Oncology

## 2016-05-26 DIAGNOSIS — Z510 Encounter for antineoplastic radiation therapy: Secondary | ICD-10-CM | POA: Diagnosis not present

## 2016-05-27 ENCOUNTER — Encounter: Payer: Self-pay | Admitting: Radiation Oncology

## 2016-05-27 ENCOUNTER — Ambulatory Visit
Admission: RE | Admit: 2016-05-27 | Discharge: 2016-05-27 | Disposition: A | Discharge status: Home / Self Care | Payer: Medicare Other | Source: Ambulatory Visit | Attending: Radiation Oncology | Admitting: Radiation Oncology

## 2016-05-27 VITALS — BP 148/63 | HR 72 | Temp 97.8°F | Ht 62.0 in | Wt 176.7 lb

## 2016-05-27 DIAGNOSIS — C50312 Malignant neoplasm of lower-inner quadrant of left female breast: Secondary | ICD-10-CM

## 2016-05-27 DIAGNOSIS — Z510 Encounter for antineoplastic radiation therapy: Secondary | ICD-10-CM | POA: Diagnosis not present

## 2016-05-27 NOTE — Progress Notes (Signed)
  Radiation Oncology         [phone] ________________________________  Name: [place] MRN: [id number]  Date: 05/27/2016  DOB: 07-31-1941  Weekly Radiation Therapy Management   ICD-9-CM ICD-10-CM   1. 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 . . . . . . . The patient presents for routine under treatment assessment.                          [place] is here for her 4th fraction of radiation to her Left Breast. She denies pain except chronic back pain. She reports some fatigue and dizziness at times. She is using the radiaplex cream twice daily to her radiation site.                                 Set-up films were reviewed.                                 The chart was checked. Physical Findings. . .  height is 5\' 2"  (1.575 m) and weight is 176 lb 11.2 oz (80.151 kg). Her temperature is 97.8 F (36.6 C). Her blood pressure is 148/63 and her pulse is 72. Her oxygen saturation is 100%. . Weight essentially stable. Some mild hyperpigmentation changes in the treatment area. Impression . . . . . . . The patient is tolerating radiation. Plan . . . . . . . . . . . . Continue treatment as planned. I recommended she follow-up with her primary care physician concerning her dizziness.  ________________________________   Cindy Promise, PhD, MD  This document serves as a record of services personally performed by Cindy Pray, MD. It was created on his behalf by [place], a trained medical scribe. The creation of this record is based on the scribe's personal observations and the provider's statements to them. This document has been checked and approved by the attending provider.

## 2016-05-27 NOTE — Progress Notes (Signed)
Ms. Rauch is here for her 4th fraction of radiation to her Left Breast. She denies pain except chronic back pain. She reports some fatigue and dizziness at times. She has no skin changes to her Left Breast at this time except darkening to her lumpectomy site. She is using the radiaplex cream twice daily to her Radiation site.   BP 148/63 mmHg  Pulse 72  Temp(Src) 97.8 F (36.6 C)  Ht 5\' 2"  (1.575 m)  Wt 176 lb 11.2 oz (80.151 kg)  BMI 32.31 kg/m2  SpO2 100%   Wt Readings from Last 3 Encounters:  05/27/16 176 lb 11.2 oz (80.151 kg)  05/04/16 177 lb 11.2 oz (80.604 kg)  04/15/16 172 lb 6.4 oz (78.2 kg)

## 2016-05-28 ENCOUNTER — Ambulatory Visit
Admission: RE | Admit: 2016-05-28 | Discharge: 2016-05-28 | Disposition: A | Discharge status: Home / Self Care | Payer: Medicare Other | Source: Ambulatory Visit | Attending: Radiation Oncology | Admitting: Radiation Oncology

## 2016-05-28 ENCOUNTER — Telehealth: Payer: Self-pay | Admitting: *Deleted

## 2016-05-28 ENCOUNTER — Other Ambulatory Visit: Payer: Self-pay | Admitting: Oncology

## 2016-05-28 DIAGNOSIS — Z510 Encounter for antineoplastic radiation therapy: Secondary | ICD-10-CM | POA: Diagnosis not present

## 2016-05-28 NOTE — Telephone Encounter (Signed)
Spoke with patient to follow up after start of radiation.  She states she is doing fine.  Encouraged her to call with any needs or concerns.

## 2016-05-29 ENCOUNTER — Ambulatory Visit
Admission: RE | Admit: 2016-05-29 | Discharge: 2016-05-29 | Disposition: A | Discharge status: Home / Self Care | Payer: Medicare Other | Source: Ambulatory Visit | Attending: Radiation Oncology | Admitting: Radiation Oncology

## 2016-05-29 DIAGNOSIS — Z510 Encounter for antineoplastic radiation therapy: Secondary | ICD-10-CM | POA: Diagnosis not present

## 2016-06-01 ENCOUNTER — Ambulatory Visit
Admission: RE | Admit: 2016-06-01 | Discharge: 2016-06-01 | Disposition: A | Discharge status: Home / Self Care | Payer: Medicare Other | Source: Ambulatory Visit | Attending: Radiation Oncology | Admitting: Radiation Oncology

## 2016-06-01 ENCOUNTER — Encounter: Payer: Self-pay | Admitting: Radiation Oncology

## 2016-06-01 VITALS — BP 140/58 | HR 69 | Temp 98.0°F | Ht 62.0 in | Wt 177.0 lb

## 2016-06-01 DIAGNOSIS — C50312 Malignant neoplasm of lower-inner quadrant of left female breast: Secondary | ICD-10-CM

## 2016-06-01 DIAGNOSIS — Z510 Encounter for antineoplastic radiation therapy: Secondary | ICD-10-CM | POA: Diagnosis not present

## 2016-06-01 NOTE — Progress Notes (Signed)
   Weekly Management Note:  outpatient    ICD-9-CM ICD-10-CM   1. 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  CBCT/MVCT images/Port film x-rays were reviewed.  The chart was checked. Doing well  Physical Findings:  height is 5\' 2"  (1.575 m) and weight is 177 lb (80.287 kg). Her temperature is 98 F (36.7 C). Her blood pressure is 140/58 and her pulse is 69.   Wt Readings from Last 3 Encounters:  06/01/16 177 lb (80.287 kg)  05/27/16 176 lb 11.2 oz (80.151 kg)  05/04/16 177 lb 11.2 oz (80.604 kg)   Seroma at lumpectomy site, moderate.  Skin of left breast is hyperpigmented  Impression:  The patient is tolerating radiotherapy.  Plan:  Continue radiotherapy as planned.    ________________________________   Eppie Gibson, M.D.

## 2016-06-01 NOTE — Progress Notes (Signed)
[place] is here for her 7th fraction of radiation to her Left Breast. She reports some fatigue. She has some hyperpigmentation at her Lumpectomy site and she is using the Radiaplex cream as directed. She denies pain except chronic back pain.   BP 140/58 mmHg  Pulse 69  Temp(Src) 98 F (36.7 C)  Ht 5\' 2"  (1.575 m)  Wt 177 lb (80.287 kg)  BMI 32.37 kg/m2

## 2016-06-02 ENCOUNTER — Ambulatory Visit
Admission: RE | Admit: 2016-06-02 | Discharge: 2016-06-02 | Disposition: A | Discharge status: Home / Self Care | Payer: Medicare Other | Source: Ambulatory Visit | Attending: Radiation Oncology | Admitting: Radiation Oncology

## 2016-06-02 DIAGNOSIS — Z510 Encounter for antineoplastic radiation therapy: Secondary | ICD-10-CM | POA: Diagnosis not present

## 2016-06-03 ENCOUNTER — Ambulatory Visit
Admission: RE | Admit: 2016-06-03 | Discharge: 2016-06-03 | Disposition: A | Discharge status: Home / Self Care | Payer: Medicare Other | Source: Ambulatory Visit | Attending: Radiation Oncology | Admitting: Radiation Oncology

## 2016-06-03 DIAGNOSIS — Z510 Encounter for antineoplastic radiation therapy: Secondary | ICD-10-CM | POA: Diagnosis not present

## 2016-06-04 ENCOUNTER — Ambulatory Visit
Admission: RE | Admit: 2016-06-04 | Discharge: 2016-06-04 | Disposition: A | Discharge status: Home / Self Care | Payer: Medicare Other | Source: Ambulatory Visit | Attending: Radiation Oncology | Admitting: Radiation Oncology

## 2016-06-04 DIAGNOSIS — Z510 Encounter for antineoplastic radiation therapy: Secondary | ICD-10-CM | POA: Diagnosis not present

## 2016-06-05 ENCOUNTER — Ambulatory Visit
Admission: RE | Admit: 2016-06-05 | Discharge: 2016-06-05 | Disposition: A | Discharge status: Home / Self Care | Payer: Medicare Other | Source: Ambulatory Visit | Attending: Radiation Oncology | Admitting: Radiation Oncology

## 2016-06-05 DIAGNOSIS — Z510 Encounter for antineoplastic radiation therapy: Secondary | ICD-10-CM | POA: Diagnosis not present

## 2016-06-08 ENCOUNTER — Ambulatory Visit
Admission: RE | Admit: 2016-06-08 | Discharge: 2016-06-08 | Disposition: A | Discharge status: Home / Self Care | Payer: Medicare Other | Source: Ambulatory Visit | Attending: Radiation Oncology | Admitting: Radiation Oncology

## 2016-06-08 ENCOUNTER — Encounter: Payer: Self-pay | Admitting: Radiation Oncology

## 2016-06-08 VITALS — BP 151/55 | HR 71 | Temp 98.0°F | Ht 62.0 in | Wt 177.5 lb

## 2016-06-08 DIAGNOSIS — C50312 Malignant neoplasm of lower-inner quadrant of left female breast: Secondary | ICD-10-CM

## 2016-06-08 DIAGNOSIS — Z510 Encounter for antineoplastic radiation therapy: Secondary | ICD-10-CM | POA: Diagnosis not present

## 2016-06-08 NOTE — Progress Notes (Signed)
   Weekly Management Note:  outpatient    ICD-9-CM ICD-10-CM   1. 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  CBCT/MVCT images/Port film x-rays were reviewed.  The chart was checked. Doing well except feels like she is getting a cold, with some fatigue.  Physical Findings:  height is 5\' 2"  (1.575 m) and weight is 177 lb 8 oz (80.513 kg). Her temperature is 98 F (36.7 C). Her blood pressure is 151/55 and her pulse is 71.   Wt Readings from Last 3 Encounters:  06/08/16 177 lb 8 oz (80.513 kg)  06/01/16 177 lb (80.287 kg)  05/27/16 176 lb 11.2 oz (80.151 kg)     Skin of left breast is hyperpigmented/intact  Impression:  The patient is tolerating radiotherapy.  Plan:  Continue radiotherapy as planned.   1 mo followup post tx.  ________________________________   Eppie Gibson, M.D.

## 2016-06-08 NOTE — Progress Notes (Signed)
[place] is here for her 12th fraction of radiation to her Left Breast. She denies pain, but does report she is not feeling well and may be getting a cold. She does report fatigue, and will rest at times. Her Left Breast is hyperpigmented to her Axilla and near the Lumpectomy site. She is using the Radiaplex cream as directed twice daily.   BP 151/55 mmHg  Pulse 71  Temp(Src) 98 F (36.7 C)  Ht 5\' 2"  (1.575 m)  Wt 177 lb 8 oz (80.513 kg)  BMI 32.46 kg/m2   Wt Readings from Last 3 Encounters:  06/08/16 177 lb 8 oz (80.513 kg)  06/01/16 177 lb (80.287 kg)  05/27/16 176 lb 11.2 oz (80.151 kg)

## 2016-06-09 ENCOUNTER — Ambulatory Visit
Admission: RE | Admit: 2016-06-09 | Discharge: 2016-06-09 | Disposition: A | Discharge status: Home / Self Care | Payer: Medicare Other | Source: Ambulatory Visit | Attending: Radiation Oncology | Admitting: Radiation Oncology

## 2016-06-09 DIAGNOSIS — Z510 Encounter for antineoplastic radiation therapy: Secondary | ICD-10-CM | POA: Diagnosis not present

## 2016-06-10 ENCOUNTER — Ambulatory Visit
Admission: RE | Admit: 2016-06-10 | Discharge: 2016-06-10 | Disposition: A | Discharge status: Home / Self Care | Payer: Medicare Other | Source: Ambulatory Visit | Attending: Radiation Oncology | Admitting: Radiation Oncology

## 2016-06-10 DIAGNOSIS — Z510 Encounter for antineoplastic radiation therapy: Secondary | ICD-10-CM | POA: Diagnosis not present

## 2016-06-11 ENCOUNTER — Ambulatory Visit
Admission: RE | Admit: 2016-06-11 | Discharge: 2016-06-11 | Disposition: A | Discharge status: Home / Self Care | Payer: Medicare Other | Source: Ambulatory Visit | Attending: Radiation Oncology | Admitting: Radiation Oncology

## 2016-06-11 DIAGNOSIS — Z510 Encounter for antineoplastic radiation therapy: Secondary | ICD-10-CM | POA: Diagnosis not present

## 2016-06-12 ENCOUNTER — Telehealth: Payer: Self-pay | Admitting: *Deleted

## 2016-06-12 ENCOUNTER — Ambulatory Visit
Admission: RE | Admit: 2016-06-12 | Discharge: 2016-06-12 | Disposition: A | Discharge status: Home / Self Care | Payer: Medicare Other | Source: Ambulatory Visit | Attending: Radiation Oncology | Admitting: Radiation Oncology

## 2016-06-12 ENCOUNTER — Encounter: Payer: Self-pay | Admitting: Radiation Oncology

## 2016-06-12 DIAGNOSIS — Z510 Encounter for antineoplastic radiation therapy: Secondary | ICD-10-CM | POA: Diagnosis not present

## 2016-06-12 NOTE — Telephone Encounter (Signed)
Left message to follow up after radiation completion.   

## 2016-06-16 ENCOUNTER — Telehealth: Payer: Self-pay | Admitting: Oncology

## 2016-06-16 ENCOUNTER — Ambulatory Visit (HOSPITAL_BASED_OUTPATIENT_CLINIC_OR_DEPARTMENT_OTHER): Payer: Medicare Other | Admitting: Oncology

## 2016-06-16 VITALS — BP 164/72 | HR 64 | Temp 98.4°F | Resp 18 | Ht 62.0 in | Wt 177.3 lb

## 2016-06-16 DIAGNOSIS — M545 Low back pain: Secondary | ICD-10-CM

## 2016-06-16 DIAGNOSIS — Z170 Estrogen receptor positive status [ER+]: Secondary | ICD-10-CM

## 2016-06-16 DIAGNOSIS — C50312 Malignant neoplasm of lower-inner quadrant of left female breast: Secondary | ICD-10-CM

## 2016-06-16 DIAGNOSIS — R5383 Other fatigue: Secondary | ICD-10-CM

## 2016-06-16 DIAGNOSIS — D0512 Intraductal carcinoma in situ of left breast: Secondary | ICD-10-CM | POA: Diagnosis not present

## 2016-06-16 MED ORDER — ANASTROZOLE 1 MG PO TABS: 1.0000 mg | ORAL_TABLET | Freq: Every day | ORAL | Status: DC

## 2016-06-16 NOTE — Telephone Encounter (Signed)
please ignore prevoius note-per pof to sch pt appt-gave pt copy of avs

## 2016-06-16 NOTE — Progress Notes (Signed)
[facility]  Telephone:([phone]) [phone] [phone]) [phone]     ID: Cindy Whitaker DOB: 1941-08-20  MR#: [id number]  XLK#:[id number]  Patient Care Team: Haywood Pao, MD as PCP - General (Internal Medicine) Chauncey Cruel, MD as Consulting Physician (Oncology) Cindy Luna, MD as Consulting Physician (General Surgery) Eppie Gibson, MD as Attending Physician (Radiation Oncology) Juanita Craver, MD as Consulting Physician (Gastroenterology) Iven Finn, DMD (Dentistry) Cherlyn Roberts, OD (Optometry) PCP: Haywood Pao, MD OTHER MD:  CHIEF COMPLAINT: Invasive ductal carcinoma, estrogen receptor positive  CURRENT TREATMENT: to start anastrozole   BREAST CANCER HISTORY: From the original intake note:  The patient had routine screening mammography at [facility] 03/02/2016 with tomosynthesis. This showed the breast density to be category be. In the left breast lower inner quadrant there was a new oval mass and ultrasound performed the next day confirmed a 9 mm lobulated mass which was hypoechoic and showed increased vascularity. The left axilla was sonographically benign.  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  Her subsequent history is as detailed below  INTERVAL HISTORY: [name] returns today for follow-up of her ductal carcinoma in situ accompanied by her husband [place]. Since her last visit here she had her definitive surgery, on 04/15/2016. This showed ([id number]) an invasive ductal carcinoma, grade 2, measuring 0.7 cm. This was HER-2 negative, with a signals ratio of 1.58 and the number per cell 1.90. It was estrogen and progesterone 100% positive with strong staining intensity. The MIB-1 was 15%.  Given the patient's age  and the size of the tumor and the low proliferation fraction it was felt chemotherapy could be forgone even without an Oncotype so the patient proceeded to radiation treatments. She completed these 06/08/2016. She is here now to discuss anti-estrogens.  REVIEW OF SYSTEMS: Danecia experienced considerable pain after the surgery. She is now on ibuprofen but still complains of soreness and sensitivity over the surgical breast. She was constipated from the hydrocodone she took originally but that has resolved. She did not have problems with bleeding or fever. She then underwent radiation, and this caused her skin to significantly darkened. This concerns her. It also made her very tired. She still describes herself is moderately fatigued. Occasionally her ankles swell. She has chronic low back pain which comes and goes and is not more intense or persistent than before. Aside from these issues a detailed review of systems today was stable  PAST MEDICAL HISTORY: Past Medical History  Diagnosis Date  . 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 Breast cancer ([place])   . Hypertension   . Osteoporosis   . Hyperlipidemia   . Glaucoma     PAST SURGICAL HISTORY: Past Surgical History  Procedure Laterality Date  . Foot surgery Right   . Breast lumpectomy with radioactive seed localization Left 04/15/2016    Procedure: BREAST LUMPECTOMY WITH RADIOACTIVE SEED LOCALIZATION;  Surgeon: Cindy Luna, MD;  Location: [facility] [place];  Service: General;  Laterality: Left;    FAMILY HISTORY No family history on file. The patient's father died at the age of 80, from "old age" the patient's mother died from tuberculosis at the age of 84. The patient has 3 brothers, 5 sisters. There is no history of breast or  ovarian cancer in the family to her knowledge  GYNECOLOGIC HISTORY:  No LMP recorded. Patient is postmenopausal. Menarche age 30, first live birth age 16. The  patient is GX P3. She stopped having periods in April 1995. She did not take hormone replacement. She never used oral contraceptives.  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 She is originally from [place].  Her husband Cindy Whitaker is a retired Education officer, museum. Their daughter Cindy Whitaker lives in [place] and is a Programmer, systems; daughter Cindy Whitaker Lives in [place] where she works as a Research scientist (physical sciences). Son Cindy Whitaker this in [place] where he works as a Government social research officer. The patient has 3 grandchildren. She attends a local [place]: Not in place   HEALTH MAINTENANCE: Social History  Substance Use Topics  . Smoking status: Never Smoker   . Smokeless tobacco: Not on file  . Alcohol Use: No     Colonoscopy:09/17/2014/[facility]  PAP: 2014  Bone density: 01/28/2016, lowest T score -1.7   Lipid panel:  No Known Allergies  Current Outpatient Prescriptions  Medication Sig Dispense Refill  . amLODipine (NORVASC) 5 MG tablet TK 1 T PO QD  5  . Ascorbic Acid (VITAMIN C) 100 MG tablet Take 600 mg by mouth daily.    Marland Kitchen atorvastatin (LIPITOR) 10 MG tablet Take 10 mg by mouth daily.    . Calcium Carbonate (CALCIUM 600 HIGH POTENCY PO) Take by mouth.    . Cholecalciferol (VITAMIN D3) 5000 units CAPS Take by mouth.    . dorzolamide-timolol (COSOPT) 22.3-6.8 MG/ML ophthalmic solution Place 1 drop into the left eye 2 (two) times daily.    . Flaxseed, Linseed, (FLAXSEED OIL) 1000 MG CAPS Take by mouth.    Marland Kitchen HYDROcodone-acetaminophen (NORCO) 5-325 MG tablet Take 1 tablet by mouth every 6 (six) hours as needed for moderate pain. (Patient not taking: Reported on 05/04/2016) 30 tablet 0  . ibuprofen (ADVIL,MOTRIN) 200 MG tablet Take 200 mg by mouth every 6 (six) hours as needed.    . latanoprost (XALATAN) 0.005 % ophthalmic solution Place 1 drop into both eyes at bedtime.    Marland Kitchen losartan (COZAAR) 50 MG tablet Take 50 mg by  mouth daily.    . Omega-3 Fatty Acids (FISH OIL) 1000 MG CPDR Take by mouth.     No current facility-administered medications for this visit.    OBJECTIVE: Middle-aged African woman In no acute distress Filed Vitals:   06/16/16 1350  BP: 164/72  Pulse: 64  Temp: 98.4 F (36.9 C)  Resp: 18     Body mass index is 32.42 kg/(m^2).    ECOG FS:1 - Symptomatic but completely ambulatory  Sclerae unicteric, EOMs intact Oropharynx clear, dentition in good repair No cervical or supraclavicular adenopathy Lungs no rales or rhonchi Heart regular rate and rhythm Abd soft, obese, nontender, positive bowel sounds MSK no focal spinal tenderness, no upper extremity lymphedema Neuro: nonfocal, well oriented, appropriate affect Breasts: The right breast is unremarkable. The left breast is status post lumpectomy and radiation. There is hyperpigmentation over the radiation port area. There is no desquamation. There is no evidence of local recurrence. The left axilla is benign.   LAB RESULTS:  CMP     Component Value Date/Time   NA 139 03/18/2016 1240   NA 141 04/04/2010 0236   K 3.9 03/18/2016 1240   K 3.6 04/04/2010 0236   CL 102 04/04/2010 0236  CO2 27 03/18/2016 1240   CO2 31 04/04/2010 0236   GLUCOSE 108 03/18/2016 1240   GLUCOSE 124* 04/04/2010 0236   BUN 15.9 03/18/2016 1240   BUN 23 04/04/2010 0236   CREATININE 1.1 03/18/2016 1240   CREATININE 0.98 04/04/2010 0236   CALCIUM 9.6 03/18/2016 1240   CALCIUM 9.2 04/04/2010 0236   PROT 8.1 03/18/2016 1240   PROT 7.8 04/04/2010 0236   ALBUMIN 3.8 03/18/2016 1240   ALBUMIN 3.9 04/04/2010 0236   AST 20 03/18/2016 1240   AST 24 04/04/2010 0236   ALT 16 03/18/2016 1240   ALT 26 04/04/2010 0236   ALKPHOS 63 03/18/2016 1240   ALKPHOS 50 04/04/2010 0236   BILITOT 0.63 03/18/2016 1240   BILITOT 1.0 04/04/2010 0236   GFRNONAA 57* 04/04/2010 0236   GFRAA  04/04/2010 0236    >60        The eGFR has been calculated using the MDRD  equation. This calculation has not been validated in all clinical situations. eGFR's persistently <60 mL/min signify possible Chronic Kidney Disease.    INo results found for: SPEP, UPEP  Lab Results  Component Value Date   WBC 9.5 03/18/2016   NEUTROABS 4.1 03/18/2016   HGB 12.2 03/18/2016   HCT 34.2* 03/18/2016   MCV 86.1 03/18/2016   PLT 242 03/18/2016      Chemistry      Component Value Date/Time   NA 139 03/18/2016 1240   NA 141 04/04/2010 0236   K 3.9 03/18/2016 1240   K 3.6 04/04/2010 0236   CL 102 04/04/2010 0236   CO2 27 03/18/2016 1240   CO2 31 04/04/2010 0236   BUN 15.9 03/18/2016 1240   BUN 23 04/04/2010 0236   CREATININE 1.1 03/18/2016 1240   CREATININE 0.98 04/04/2010 0236      Component Value Date/Time   CALCIUM 9.6 03/18/2016 1240   CALCIUM 9.2 04/04/2010 0236   ALKPHOS 63 03/18/2016 1240   ALKPHOS 50 04/04/2010 0236   AST 20 03/18/2016 1240   AST 24 04/04/2010 0236   ALT 16 03/18/2016 1240   ALT 26 04/04/2010 0236   BILITOT 0.63 03/18/2016 1240   BILITOT 1.0 04/04/2010 0236       No results found for: LABCA2  No components found for: LABCA125  No results for input(s): INR in the last 168 hours.  Urinalysis    Component Value Date/Time   COLORURINE YELLOW 04/04/2010 0228   APPEARANCEUR CLEAR 04/04/2010 0228   LABSPEC 1.021 04/04/2010 0228   PHURINE 8.5* 04/04/2010 0228   GLUCOSEU NEGATIVE 04/04/2010 0228   HGBUR NEGATIVE 04/04/2010 0228   BILIRUBINUR NEGATIVE 04/04/2010 0228   KETONESUR NEGATIVE 04/04/2010 0228   PROTEINUR 30* 04/04/2010 0228   UROBILINOGEN 0.2 04/04/2010 0228   NITRITE NEGATIVE 04/04/2010 0228   LEUKOCYTESUR SMALL* 04/04/2010 0228      ELIGIBLE FOR AVAILABLE RESEARCH PROTOCOL: No   STUDIES: No results found.   ASSESSMENT: 75 y.o.  woman Status post left breastLower inner quadrant biopsy 03/09/2016 for ductal carcinoma in situ, grade 1, estrogen and progesterone receptor positive  (1) status  post left lumpectomy 04/15/2016 for a pT1b pNX, stage IA invasive ductal carcinoma measuring 0.7 cm, grade 2, with negative margins.  (2) adjuvant radiation completed 06/08/2016  (3) to start anastrozole 07/28/2016  (4) osteopenia, with a T score of -1.7 on bone density scan January 2017.  PLAN: I spent approximately 45 minutes with Nakaiya and her husband going over her changed situation. Initially we believed  she had noninvasive breast cancer, but when this was removed it turned out there was a 7 mm area of invasive disease. This was not high-grade and it was not fast growing. It was strongly estrogen receptor positive and HER-2 nonamplified.  We discussed her case and felt that a sentinel lymph node sampling would not change management and that we would not need an Oncotype to tell us that she would get very little benefit from adjuvant chemotherapy. Accordingly the plan was to forego chemotherapy and to proceed with radiation.  She completed her radiation treatments last week. She generally did well with dose although she is concerned about the hyperpigmentation in her left breast and she remains significantly fatigued. We did go over the fact that the more she exercises the sooner she is going to get out of that whole period  We then discussed systemic therapy. She understands why we did not recommend chemotherapy and she understands why we do recommend anti-estrogen pills. We reviewed the fact that her breast cancer cells "eat" estrogen and that estrogen makes them grow and make more cells. Antii-estrogens on the contrary will starve them of estrogen so they don't grow back  After some discussion we decided we would start with anastrozole. We reviewed the possible toxicities side effects and complications of this agent. I think it would be prudent for her to wait until August 1 to start. She is going to return to see me in October.  If she tolerates anastrozole well she will take it for 5  years. We will also optimize her bone density issues. If she does not tolerate anastrozole well we will likely switch to tamoxifen  She knows to call for any problems that may develop before her next visit   Chauncey Cruel, MD   06/16/2016 2:01 PM Medical Oncology and Hematology [facility] [place], [place] [address] Tel. [phone]    Fax. [phone]

## 2016-06-16 NOTE — Telephone Encounter (Signed)
per pof to sch pt appt-gave pt copy of avs-Dr Bo Merino to make 3 week appt instead of 6

## 2016-06-17 ENCOUNTER — Other Ambulatory Visit: Payer: Self-pay | Admitting: *Deleted

## 2016-06-17 MED ORDER — ANASTROZOLE 1 MG PO TABS: 1.0000 mg | ORAL_TABLET | Freq: Every day | ORAL | Status: DC

## 2016-06-17 NOTE — Progress Notes (Signed)
  Radiation Oncology         [phone]) [phone] ________________________________  Name: [place] MRN: [id number]  Date: 06/12/2016  DOB: 22-May-1941  End of Treatment Note  Diagnosis:   T1bN0M0 (Stage IA) left breast invasive ductal carcinoma  Indication for treatment:  Curative        Radiation treatment dates:   05/22/16- 06/12/16  Site/dose:  Left breast treated to 42.56 Gy in 16 fractions  Beams/energy:   3D / 10X, 15X, 6X  Narrative: The patient tolerated radiation treatment relatively well.     Plan: The patient has completed radiation treatment. The patient will return to radiation oncology clinic for routine followup in one month. I advised them to call or return sooner if they have any questions or concerns related to their recovery or treatment.  -----------------------------------  Eppie Gibson, MD  This document serves as a record of services personally performed by Eppie Gibson, MD. It was created on her behalf by Derek Mound, a trained medical scribe. The creation of this record is based on the scribe's personal observations and the provider's statements to them. This document has been checked and approved by the attending provider.

## 2016-07-13 NOTE — Progress Notes (Addendum)
Radiation Oncology         ([phone]) [phone] ________________________________  Name: [place]indy Whitaker MRN: [id number]  Date: 05/13/2016  DOB: 07-27-1941  SIMULATION AND TREATMENT PLANNING NOTE   Special tx procedure   outpatient  DIAGNOSIS:    1. 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  Identity was confirmed.  All relevant records and images related to the planned course of therapy were reviewed.  The patient freely provided informed written consent to proceed with treatment after reviewing the details related to the planned course of therapy. The consent form was witnessed and verified by the simulation staff.    Then, the patient was set-up in a stable reproducible supine position for radiation therapy with her ipsilateral arm over her head, and her upper body secured in a custom-made Vac-lok device.  CT images were obtained.  Surface markings were placed.  The CT images were loaded into the planning software.    Special treatment procedure was performed today due to the extra time and effort required by myself to plan and prepare this patient for deep inspiration breath hold technique.  I have determined cardiac sparing to be of benefit to this patient to prevent long term cardiac damage due to radiation of the heart.  Bellows were placed on the patient's abdomen. To facilitate cardiac sparing, the patient was coached by the radiation therapists on breath hold techniques and breathing practice was performed. Practice waveforms were obtained. The patient was then scanned while maintaining breath hold in the treatment position.  This image was then transferred over to the imaging specialist. The imaging specialist then created a fusion of the free breathing and breath hold scans using the chest wall as the stable structure. I personally reviewed the fusion in axial, coronal and sagittal  image planes.  Excellent cardiac sparing was obtained.  I felt the patient is an appropriate candidate for breath hold and the patient will be treated as such.  The image fusion was then reviewed with the patient to reinforce the necessity of reproducible breath hold.   TREATMENT PLANNING NOTE: Treatment planning then occurred.  The radiation prescription was entered and confirmed.     A total of 3 medically necessary complex treatment devices were fabricated and supervised by me: 2 fields with MLCs for custom blocks to protect heart, and lungs;  and, a Vac-lok. MORE COMPLEX DEVICES MAY BE MADE IN DOSIMETRY FOR FIELD IN FIELD BEAMS FOR DOSE HOMOGENEITY.  I have requested : 3D Simulation  I have requested a DVH of the following structures: lungs, heart, lumpectomy cavity.    The patient will receive 42.56 Gy in 16 fractions to the left breast with 2 tangential fields.   This will not be followed by a boost.  Optical Surface Tracking Plan:  Since intensity modulated radiotherapy (IMRT) and 3D conformal radiation treatment methods are predicated on accurate and precise positioning for treatment, intrafraction motion monitoring is medically necessary to ensure accurate and safe treatment delivery. The ability to quantify intrafraction motion without excessive ionizing radiation dose can only be performed with optical surface tracking. Accordingly, surface imaging offers the opportunity to obtain 3D measurements of patient position throughout IMRT and 3D treatments without excessive radiation exposure. I am ordering optical surface tracking for this patient's upcoming course of radiotherapy.  ________________________________   Reference:  Particia Jasper, et al. Surface  imaging-based analysis of intrafraction motion for breast radiotherapy patients.Journal of [place], n. 6, nov. 2014. ISSN [id number].  Available at:  <http://www.jacmp.org/index.php/jacmp/article/view/4957>.    -----------------------------------  Eppie Gibson, MD

## 2016-07-15 ENCOUNTER — Encounter: Payer: Self-pay | Admitting: Radiation Oncology

## 2016-07-17 ENCOUNTER — Other Ambulatory Visit: Payer: Self-pay | Admitting: *Deleted

## 2016-07-17 DIAGNOSIS — C50312 Malignant neoplasm of lower-inner quadrant of left female breast: Secondary | ICD-10-CM

## 2016-07-20 ENCOUNTER — Telehealth: Payer: Self-pay | Admitting: Oncology

## 2016-07-20 NOTE — Telephone Encounter (Signed)
Called patient to confirm appointment. Left Voice message. Appointment letter and schedule mailed. Merleen Nicely.

## 2016-07-24 ENCOUNTER — Encounter: Payer: Self-pay | Admitting: Radiation Oncology

## 2016-07-24 ENCOUNTER — Ambulatory Visit
Admission: RE | Admit: 2016-07-24 | Discharge: 2016-07-24 | Disposition: A | Discharge status: Home / Self Care | Payer: Medicare Other | Source: Ambulatory Visit | Attending: Radiation Oncology | Admitting: Radiation Oncology

## 2016-07-24 VITALS — BP 161/78 | HR 67 | Temp 98.1°F | Ht 62.0 in | Wt 178.3 lb

## 2016-07-24 DIAGNOSIS — C50312 Malignant neoplasm of lower-inner quadrant of left female breast: Secondary | ICD-10-CM | POA: Diagnosis not present

## 2016-07-24 HISTORY — DX: Personal history of irradiation: Z92.3

## 2016-07-24 NOTE — Progress Notes (Signed)
Radiation Oncology         [phone] ________________________________  Name: Cindy Whitaker MRN: [id number]  Date: 07/24/2016  DOB: 1941-07-03  Follow-Up Visit Note  Outpatient  CC: Haywood Pao, MD  Magrinat, Virgie Dad, MD  Diagnosis and Prior Radiotherapy:    ICD-9-CM ICD-10-CM   1. 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 She denies pain, except an occasional "twinge" to her left breast.  She reports some fatigue, and states that she is going to the gym during the week. She is using Vitamin E cream to her left breast for skin care. She plans to start her Anastrozole 07/28/16.  ALLERGIES:  has No Known Allergies.  Meds: Current Outpatient Prescriptions  Medication Sig Dispense Refill  . amLODipine (NORVASC) 5 MG tablet TK 1 T PO QD  5  . Ascorbic Acid (VITAMIN C) 100 MG tablet Take 600 mg by mouth daily.    Marland Kitchen atorvastatin (LIPITOR) 10 MG tablet Take 10 mg by mouth daily.    . Calcium Carbonate (CALCIUM 600 HIGH POTENCY PO) Take by mouth.    . Cholecalciferol (VITAMIN D3) 5000 units CAPS Take by mouth.    . dorzolamide-timolol (COSOPT) 22.3-6.8 MG/ML ophthalmic solution Place 1 drop into the left eye 2 (two) times daily.    . Flaxseed, Linseed, (FLAXSEED OIL) 1000 MG CAPS Take by mouth.    Marland Kitchen ibuprofen (ADVIL,MOTRIN) 200 MG tablet Take 200 mg by mouth every 6 (six) hours as needed.    . latanoprost (XALATAN) 0.005 % ophthalmic solution Place 1 drop into both eyes at bedtime.    Marland Kitchen losartan (COZAAR) 50 MG tablet Take 50 mg by mouth daily.    . Omega-3 Fatty Acids (FISH OIL) 1000 MG CPDR Take by mouth.    Marland Kitchen anastrozole (ARIMIDEX) 1 MG tablet Take 1 tablet (1 mg total) by mouth daily. (Patient not taking: Reported on 07/24/2016) 90 tablet 4   No  current facility-administered medications for this encounter.     Physical Findings: The patient is in no acute distress. Patient is alert and oriented.  height is 5\' 2"  (1.575 m) and weight is 178 lb 4.8 oz (80.9 kg). Her temperature is 98.1 F (36.7 C). Her blood pressure is 161/78 (abnormal) and her pulse is 67.   Residual hyperpigmentation over the left breast with residual dryness. Skin intact.  Lab Findings: Lab Results  Component Value Date   WBC 9.5 03/18/2016   HGB 12.2 03/18/2016   HCT 34.2 (L) 03/18/2016   MCV 86.1 03/18/2016   PLT 242 03/18/2016    Radiographic Findings: No results found.  Impression/Plan: She has healed well thus far. Continue vitamin E lotion for 3-4 months. I encouraged her to continue with yearly mammography and followup with medical oncology. I will see her back on an as-needed basis. I have encouraged her to call if she has any issues or concerns in the future. I wished her the very best. I encouraged her to continue regular exercise.    Eppie Gibson, MD  This document serves as a record of services personally performed by Eppie Gibson, MD. It was created on her behalf by Darcus Austin, a trained medical scribe. The creation of this record is based on the scribe's personal observations and the provider's  statements to them. This document has been checked and approved by the attending provider.

## 2016-07-24 NOTE — Addendum Note (Signed)
Encounter addended by: Ernst Spell, RN on: 07/24/2016  3:43 PM<BR>    Actions taken: Charge Capture section accepted

## 2016-07-24 NOTE — Progress Notes (Signed)
Cindy Whitaker is here for follow up of radiation completed 06/12/16 to her Left Breast. She denies pain except an occasional "twinge" to her Left Breast.  She reports some fatigue, and she states that she is going to the gym during the week. She is walking and using the elliptical. Her Left Breast is hyperpigmented. She has an area under her Left Breast which peeled after she completed radiation, which has heeled now. She is using Vitamin E cream to her Right Breast for skin care. She plans to start her Anastrozole 07/28/16. She has another appointment in October with Dr. Jana Hakim. She has a survivorship appointment with Mike Craze NP on 09/28/16.   BP (!) 161/78   Pulse 67   Temp 98.1 F (36.7 C)   Ht 5\' 2"  (1.575 m)   Wt 178 lb 4.8 oz (80.9 kg)   BMI 32.61 kg/m    Wt Readings from Last 3 Encounters:  07/24/16 178 lb 4.8 oz (80.9 kg)  06/16/16 177 lb 4.8 oz (80.4 kg)  06/08/16 177 lb 8 oz (80.5 kg)

## 2016-09-28 ENCOUNTER — Encounter: Payer: Medicare Other | Admitting: Adult Health

## 2016-09-30 ENCOUNTER — Ambulatory Visit (HOSPITAL_BASED_OUTPATIENT_CLINIC_OR_DEPARTMENT_OTHER): Payer: Medicare Other | Admitting: Adult Health

## 2016-09-30 ENCOUNTER — Encounter: Payer: Self-pay | Admitting: Adult Health

## 2016-09-30 VITALS — BP 167/83 | HR 76 | Temp 98.4°F | Resp 18 | Ht 62.0 in | Wt 170.7 lb

## 2016-09-30 DIAGNOSIS — M8580 Other specified disorders of bone density and structure, unspecified site: Secondary | ICD-10-CM

## 2016-09-30 DIAGNOSIS — G8929 Other chronic pain: Secondary | ICD-10-CM | POA: Diagnosis not present

## 2016-09-30 DIAGNOSIS — N952 Postmenopausal atrophic vaginitis: Secondary | ICD-10-CM

## 2016-09-30 DIAGNOSIS — F4321 Adjustment disorder with depressed mood: Secondary | ICD-10-CM

## 2016-09-30 DIAGNOSIS — C50312 Malignant neoplasm of lower-inner quadrant of left female breast: Secondary | ICD-10-CM

## 2016-09-30 DIAGNOSIS — M545 Low back pain: Secondary | ICD-10-CM

## 2016-09-30 DIAGNOSIS — Z170 Estrogen receptor positive status [ER+]: Secondary | ICD-10-CM

## 2016-09-30 NOTE — Progress Notes (Signed)
CLINIC:  Survivorship   REASON FOR VISIT:  Routine follow-up post-treatment for a recent history of breast cancer.  BRIEF ONCOLOGIC HISTORY:    Breast cancer of lower-inner quadrant of left female breast ([place])   12/2015 Imaging    DEXA scan: Osteopenia (per Magrinat's note; results not available for review)       03/09/2016 Initial Biopsy    (L) breast needle biopsy: Ductal carcinoma, grade 1, involving intraductal papilloma; ER+ (100%), PR+ (100%).       03/13/2016 Initial Diagnosis    Breast cancer of lower-inner quadrant of left female breast ([place])     04/15/2016 Surgery    (L) lumpectomy ([place]): IDC with papillary features, grade 2, 0.7 cm, assoc grade 2 DCIS, margins negative. Additional findings including complex sclerosing lesion and focal ADH. ER+ (100%), PR+ (100%), Ki67 15%, HER2 neg ratio (1.58).    pT1b, pNx: Stage IA       05/22/2016 - 06/12/2016 Radiation Therapy    Adjuvant radiation Isidore Moos): Left breast treated to 42.56 Gy in 16 fractions      07/2016 -  Anti-estrogen oral therapy    Anastrozole 1 mg daily. Planned duration of therapy: 5 years        INTERVAL HISTORY:  [name] presents to the [facility] today for our initial meeting to review her survivorship care plan detailing her treatment course for breast cancer, as well as monitoring long-term side effects of that treatment, education regarding health maintenance, screening, and overall wellness and health promotion.     Tragically, since her last visit to the cancer center, her daughter died suddenly in [place] after complications from strep throat. She reports that she has not been sleeping well as a result and also she has had decreased appetite. They're working on funeral arrangements now, and she will be returning back to [place] soon for burial arrangements. As a result, she would like to know if it would be okay to move her appointment with Dr. Jana Hakim that was originally scheduled  for 10/15/16, to sometime in November.  She tells me that she is "hanging in there" but some days have understandably been harder than others. She states that she is doing okay, and has good support from her husband and other family members.  With regards to her breast cancer, she started the anastrozole in early 07/2016 and is tolerating the medication well. She has some hot flashes, but they are not bothersome for her. She denies any new arthralgias; she has chronic low back pain, which is intermittent and not worse since starting the anastrozole. She has some vaginal dryness.  She had her annual flu shot with her PCP recently.   REVIEW OF SYSTEMS:  Review of Systems  Constitutional:       Decreased appetite since the death of her daughter.  HENT: Negative.   Eyes: Negative.   Respiratory: Negative.   Cardiovascular: Negative.   Gastrointestinal: Negative.   Genitourinary:       Vaginal dryness  Musculoskeletal: Positive for back pain.  Skin: Negative.   Neurological: Negative.   Endo/Heme/Allergies: Negative.   Psychiatric/Behavioral: Positive for depression. The patient is nervous/anxious and has insomnia.   GU: Denies vaginal bleeding, discharge, or dryness.  Breast: Denies any new nodularity, masses, tenderness, nipple changes, or nipple discharge.    A 14-point review of systems was completed and was negative, except as noted above.   ONCOLOGY TREATMENT TEAM:  1. Surgeon:  Dr. Brantley Stage at [facility] 2.  Medical Oncologist: Dr. Jana Hakim  3. Radiation Oncologist: Dr. Isidore Moos    PAST MEDICAL/SURGICAL HISTORY:  Past Medical History:  Diagnosis Date  . Breast cancer ([place])   . Breast cancer of lower-inner quadrant of left female breast ([place]) 03/13/2016  . Glaucoma   . Hx of radiation therapy 05/22/16- 06/12/16   Left Breast  . Hyperlipidemia   . Hypertension   . Osteoporosis    Past Surgical History:  Procedure Laterality Date  . BREAST LUMPECTOMY WITH  RADIOACTIVE SEED LOCALIZATION Left 04/15/2016   Procedure: BREAST LUMPECTOMY WITH RADIOACTIVE SEED LOCALIZATION;  Surgeon: Erroll Luna, MD;  Location: [facility];  Service: General;  Laterality: Left;  . FOOT SURGERY Right      ALLERGIES:  No Known Allergies   CURRENT MEDICATIONS:  Outpatient Encounter Prescriptions as of 09/30/2016  Medication Sig Note  . amLODipine (NORVASC) 5 MG tablet TK 1 T PO QD 03/18/2016: Received from: External Pharmacy  . anastrozole (ARIMIDEX) 1 MG tablet Take 1 tablet (1 mg total) by mouth daily. (Patient not taking: Reported on 07/24/2016)   . Ascorbic Acid (VITAMIN C) 100 MG tablet Take 600 mg by mouth daily.   Marland Kitchen atorvastatin (LIPITOR) 10 MG tablet Take 10 mg by mouth daily.   . Calcium Carbonate (CALCIUM 600 HIGH POTENCY PO) Take by mouth.   . Cholecalciferol (VITAMIN D3) 5000 units CAPS Take by mouth.   . dorzolamide-timolol (COSOPT) 22.3-6.8 MG/ML ophthalmic solution Place 1 drop into the left eye 2 (two) times daily.   . Flaxseed, Linseed, (FLAXSEED OIL) 1000 MG CAPS Take by mouth.   Marland Kitchen ibuprofen (ADVIL,MOTRIN) 200 MG tablet Take 200 mg by mouth every 6 (six) hours as needed.   . latanoprost (XALATAN) 0.005 % ophthalmic solution Place 1 drop into both eyes at bedtime.   Marland Kitchen losartan (COZAAR) 50 MG tablet Take 50 mg by mouth daily.   . Omega-3 Fatty Acids (FISH OIL) 1000 MG CPDR Take by mouth.    No facility-administered encounter medications on file as of 09/30/2016.      ONCOLOGIC FAMILY HISTORY:  No family history on file.   GENETIC COUNSELING/TESTING: None.   SOCIAL HISTORY:  Cindy Whitaker is married and lives in [place], [place].  She is retired; previously worked as a Chemical engineer.  She has 1 daughter and 1 son who are alive and well; her daughter in [place] suddenly passed away about one week ago. She denies any current tobacco, alcohol, or illicit drug use.     PHYSICAL EXAMINATION:  Vital Signs:   Vitals:    09/30/16 1038  BP: (!) 167/83  Pulse: 76  Resp: 18  Temp: 98.4 F (36.9 C)   Filed Weights   09/30/16 1038  Weight: 170 lb 11.2 oz (77.4 kg)   General: Well-nourished, well-appearing female in no acute distress.  She is unaccompanied today.   HEENT: Head is normocephalic.  Pupils equal and reactive to light. Conjunctivae clear without exudate.  Sclerae anicteric. Oral mucosa is pink, moist.  Oropharynx is pink without lesions or erythema.  Lymph: No cervical, supraclavicular, or infraclavicular lymphadenopathy noted on palpation.  Cardiovascular: Regular rate and rhythm.Marland Kitchen Respiratory: Clear to auscultation bilaterally. Chest expansion symmetric; breathing non-labored.  GI: Abdomen soft and round; non-tender, non-distended. Bowel sounds normoactive.  GU: Deferred.  Neuro: No focal deficits. Steady gait.  Psych: Mood and affect normal and appropriate for situation.  Extremities: No edema. Skin: Warm and dry.  LABORATORY DATA:  None for this visit.  DIAGNOSTIC IMAGING:  None for this visit.      ASSESSMENT AND PLAN:  Ms.. Cindy Whitaker is a pleasant 75 y.o. female with Stage IA left breast invasive ductal carcinoma, ER+/PR+/HER2-, diagnosed in 02/2016, treated with lumpectomy, adjuvant radiation therapy, and anti-estrogen therapy with anastrozole beginning in 07/2016.  She presents to the Survivorship Clinic for our initial meeting and routine follow-up post-completion of treatment for breast cancer.    1. Stage IA left breast cancer:  Ms. Grieves is continuing to recover from definitive treatment for breast cancer. I let her know that we could certainly reschedule her appointment with Dr. Jana Hakim to sometime in November, to better accommodate the funeral arrangements for her daughter. Therefore, she will follow-up with her medical oncologist, Dr. Jana Hakim in 10/2016 with history and physical exam per surveillance protocol.  She will continue her anti-estrogen therapy with anastrozole. Thus  far, she is tolerating the medication well, with minimal side effects. She was instructed to make Dr. Jana Hakim or myself aware if she begins to experience any worsening side effects of the medication and I could see her back in clinic to help manage those side effects, as needed. Common side effects of anastrozole were again reviewed with her as well. Today, a comprehensive survivorship care plan and treatment summary was reviewed with the patient today detailing her breast cancer diagnosis, treatment course, potential late/long-term effects of treatment, appropriate follow-up care with recommendations for the future, and patient education resources.  A copy of this summary, along with a letter will be sent to the patient's primary care provider via mail/fax/In Basket message after today's visit.    2. Grief response: Mrs. Munns is understandably continuing to grieve after the sudden passing of her daughter about one week ago. I offered to her support today by active listening, validation of concerns, and expressive supportive counseling. I offered to contact our oncology social worker and chaplain on her behalf to make them aware of her recent loss, so they may be able to provide some additional resources for her. The patient declined to have me do so today. She states "I am doing okay" and prefers to grieve with her family. I encouraged her to let us know how we can best support her during this difficult time. She voices appreciation.  3. Vaginal dryness: Vaginal dryness is a common complaint among women taking aromatase inhibitors like anastrozole. We discussed that she could use coconut oil as both a vaginal moisturizer as well as a lubricant for intercourse. I also shared with her the opportunity to attend the free "Healthy Pelvic Floor and Intimacy" class. This class is offered once a month and provides women with additional methods to help manage vaginal dryness and pelvic floor weakness after treatment  for breast cancer. She was given a handout today with contact information on how to get enrolled in this class in the future if she chooses.  4. Bone health:  Given Ms. Cavan's age, history of breast cancer, and her current treatment regimen including anti-estrogen therapy with anastrozole, she is at risk for bone demineralization.  We do not have her previous records of her DEXA scan available for review today, but according to Dr. Virgie Dad last note that DEXA scan performed in 12/2015 showed osteopenia. She was encouraged to continue with calcium and vitamin D supplementation as well as increase foods rich in calcium and vitamin D. She was also encouraged to increase her weight-bearing activities and was given educational specific activities to promote bone health.  I will defer any  future bone mineral density testing to Dr. Jana Hakim or her PCP, as clinically indicated.  5. Cancer screening:  Due to Ms. Reaser's history and her age, she should receive screening for skin cancers, colon cancer, and gynecologic cancers.  The information and recommendations are listed on the patient's comprehensive care plan/treatment summary and were reviewed in detail with the patient.    6. Health maintenance and wellness promotion: Ms. Montrose was encouraged to consume 5-7 servings of fruits and vegetables per day. We reviewed the "Nutrition Rainbow" handout. She was also encouraged to engage in moderate to vigorous exercise for 30 minutes per day most days of the week. We discussed the [employer] fitness program, which is designed for cancer survivors to help them become more physically fit after cancer treatments.  She was instructed to limit her alcohol consumption and continue to abstain from tobacco use.   7. Support services/counseling: It is not uncommon for this period of the patient's cancer care trajectory to be one of many emotions and stressors.  We discussed an opportunity for her to participate in the  next session of [facility] ("Finding Your New Normal") support group series designed for patients after they have completed treatment.   Ms. Madonia was encouraged to take advantage of our many other support services programs, support groups, and/or counseling in coping with her new life as a cancer survivor after completing anti-cancer treatment.  She was offered support today through active listening and expressive supportive counseling.  She was given information regarding our available services and encouraged to contact me with any questions or for help enrolling in any of our support group/programs.    Dispo:   -Return to cancer center to see Dr. Jana Hakim sometime in 10/2016, to better accommodate the patient's needs to be in [place] with her family at this time. -She is welcome to return back to the Survivorship Clinic at any time; no additional follow-up needed at this time.  -Consider referral back to survivorship as a long-term survivor for continued surveillance  A total of 60 minutes of face-to-face time was spent with this patient with greater than 50% of that time in counseling and care-coordination.   Mike Craze, NP Survivorship Program [place] [phone]   Note: PRIMARY CARE PROVIDER Haywood Pao, [name] [phone]

## 2016-10-15 ENCOUNTER — Other Ambulatory Visit: Payer: Medicare Other

## 2016-10-15 ENCOUNTER — Ambulatory Visit: Payer: Medicare Other | Admitting: Oncology

## 2016-11-05 ENCOUNTER — Other Ambulatory Visit (HOSPITAL_BASED_OUTPATIENT_CLINIC_OR_DEPARTMENT_OTHER): Payer: Medicare Other

## 2016-11-05 ENCOUNTER — Ambulatory Visit (HOSPITAL_BASED_OUTPATIENT_CLINIC_OR_DEPARTMENT_OTHER): Payer: Medicare Other | Admitting: Oncology

## 2016-11-05 VITALS — BP 158/54 | HR 62 | Temp 98.1°F | Resp 18 | Ht 62.0 in | Wt 168.9 lb

## 2016-11-05 DIAGNOSIS — D0512 Intraductal carcinoma in situ of left breast: Secondary | ICD-10-CM

## 2016-11-05 DIAGNOSIS — M8580 Other specified disorders of bone density and structure, unspecified site: Secondary | ICD-10-CM

## 2016-11-05 DIAGNOSIS — Z170 Estrogen receptor positive status [ER+]: Secondary | ICD-10-CM

## 2016-11-05 DIAGNOSIS — C50312 Malignant neoplasm of lower-inner quadrant of left female breast: Secondary | ICD-10-CM

## 2016-11-05 LAB — CBC WITH DIFFERENTIAL/PLATELET
BASO%: 0.6 % (ref 0.0–2.0)
BASOS ABS: 0 10*3/uL (ref 0.0–0.1)
EOS ABS: 0.2 10*3/uL (ref 0.0–0.5)
EOS%: 2.7 % (ref 0.0–7.0)
HCT: 36.6 % (ref 34.8–46.6)
HGB: 12.4 g/dL (ref 11.6–15.9)
LYMPH%: 34.1 % (ref 14.0–49.7)
MCH: 30.5 pg (ref 25.1–34.0)
MCHC: 33.9 g/dL (ref 31.5–36.0)
MCV: 90.1 fL (ref 79.5–101.0)
MONO#: 0.5 10*3/uL (ref 0.1–0.9)
MONO%: 8.6 % (ref 0.0–14.0)
NEUT%: 54 % (ref 38.4–76.8)
NEUTROS ABS: 3.4 10*3/uL (ref 1.5–6.5)
PLATELETS: 230 10*3/uL (ref 145–400)
RBC: 4.06 10*6/uL (ref 3.70–5.45)
RDW: 13.5 % (ref 11.2–14.5)
WBC: 6.3 10*3/uL (ref 3.9–10.3)
lymph#: 2.1 10*3/uL (ref 0.9–3.3)

## 2016-11-05 LAB — COMPREHENSIVE METABOLIC PANEL
ALBUMIN: 3.5 g/dL (ref 3.5–5.0)
ALK PHOS: 71 U/L (ref 40–150)
ALT: 13 U/L (ref 0–55)
ANION GAP: 7 meq/L (ref 3–11)
AST: 15 U/L (ref 5–34)
BILIRUBIN TOTAL: 0.54 mg/dL (ref 0.20–1.20)
BUN: 15.1 mg/dL (ref 7.0–26.0)
CO2: 25 meq/L (ref 22–29)
Calcium: 9.4 mg/dL (ref 8.4–10.4)
Chloride: 108 mEq/L (ref 98–109)
Creatinine: 1 mg/dL (ref 0.6–1.1)
EGFR: 59 mL/min/{1.73_m2} — AB (ref >90)
Glucose: 126 mg/dl (ref 70–140)
POTASSIUM: 3.9 meq/L (ref 3.5–5.1)
Sodium: 141 mEq/L (ref 136–145)
TOTAL PROTEIN: 7.8 g/dL (ref 6.4–8.3)

## 2016-11-05 NOTE — Progress Notes (Signed)
[place]  Telephone:(336) [phone] Fax:(336) [phone]     ID: Cindy Whitaker DOB: 01/31/1941  MR#: [id number]  JYN#:[id number]  Patient Care Team: Haywood Pao, MD as PCP - General (Internal Medicine) Chauncey Cruel, MD as Consulting Physician (Oncology) Erroll Luna, MD as Consulting Physician (General Surgery) Eppie Gibson, MD as Attending Physician (Radiation Oncology) Juanita Craver, MD as Consulting Physician (Gastroenterology) Iven Finn, DMD (Dentistry) Cherlyn Roberts, OD (Optometry) PCP: Haywood Pao, MD OTHER MD:  CHIEF COMPLAINT: Invasive ductal carcinoma, estrogen receptor positive  CURRENT TREATMENT: t anastrozole   BREAST CANCER HISTORY: From the original intake note:  The patient had routine screening mammography at [facility] 03/02/2016 with tomosynthesis. This showed the breast density to be category be. In the left breast lower inner quadrant there was a new oval mass and ultrasound performed the next day confirmed a 9 mm lobulated mass which was hypoechoic and showed increased vascularity. The left axilla was sonographically benign.  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  Her subsequent history is as detailed below  INTERVAL HISTORY: Cindy Whitaker returns today for follow-up of her early stage invasive ductal carcinoma. After completing radiation she started her anastrozole tablets mid-August 2017. She is tolerating this well. She has minimal hot flashes. She has no other symptoms that she is aware of. She is obtaining it currently of no cost  Tragically the patient's daughter died after a respiratory illness. This happened in [place]. The autopsy results are pending.   REVIEW OF SYSTEMS: A  detailed review of systems today was otherwise stable   PAST MEDICAL HISTORY: Past Medical History:  Diagnosis Date  . Breast cancer ([place])   . Breast cancer of lower-inner quadrant of left female breast ([place]) 03/13/2016  . Glaucoma   . Hx of radiation therapy 05/22/16- 06/12/16   Left Breast  . Hyperlipidemia   . Hypertension   . Osteoporosis     PAST SURGICAL HISTORY: Past Surgical History:  Procedure Laterality Date  . BREAST LUMPECTOMY WITH RADIOACTIVE SEED LOCALIZATION Left 04/15/2016   Procedure: BREAST LUMPECTOMY WITH RADIOACTIVE SEED LOCALIZATION;  Surgeon: Erroll Luna, MD;  Location: [place];  Service: General;  Laterality: Left;  . FOOT SURGERY Right     FAMILY HISTORY No family history on file. The patient's father died at the age of 42, from "old age" the patient's mother died from tuberculosis at the age of 80. The patient has 3 brothers, 5 sisters. There is no history of breast or ovarian cancer in the family to her knowledge  GYNECOLOGIC HISTORY:  No LMP recorded. Patient is postmenopausal. Menarche age 50, first live birth age 89. The patient is GX P3. She stopped having periods in April 1995. She did not take hormone replacement. She never used oral contraceptives.  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  Her husband [name] is a retired Education officer, museum. Their daughter [name] lives in [place] and is a Programmer, systems; daughter Alantis Bethune Lives in [place] where she works as a Research scientist (physical sciences). Son Thanya Cegielski this in [place] where he works as a Government social research officer. The patient has 3 grandchildren. She attends a local [employer]  ADVANCED DIRECTIVES: Not in place   HEALTH MAINTENANCE: Social History  Substance Use Topics  . Smoking status: Never Smoker  . Smokeless tobacco: Not on file  . Alcohol use No      Colonoscopy:09/17/2014/[facility]  PAP: 2014  Bone density: 01/28/2016, lowest T score -1.7   Lipid panel:  No Known Allergies  Current Outpatient Prescriptions  Medication Sig Dispense Refill  . amLODipine (NORVASC) 5 MG tablet TK 1 T PO QD  5  . anastrozole (ARIMIDEX) 1 MG tablet Take 1 tablet (1 mg total) by mouth daily. 90 tablet 4  . Ascorbic Acid (VITAMIN C) 100 MG tablet Take 600 mg by mouth daily.    Marland Kitchen atorvastatin (LIPITOR) 10 MG tablet Take 10 mg by mouth daily.    . Calcium Carbonate (CALCIUM 600 HIGH POTENCY PO) Take by mouth.    . Cholecalciferol (VITAMIN D3) 5000 units CAPS Take by mouth.    . dorzolamide-timolol (COSOPT) 22.3-6.8 MG/ML ophthalmic solution Place 1 drop into the left eye 2 (two) times daily.    . Flaxseed, Linseed, (FLAXSEED OIL) 1000 MG CAPS Take by mouth.    Marland Kitchen ibuprofen (ADVIL,MOTRIN) 200 MG tablet Take 200 mg by mouth every 6 (six) hours as needed.    . latanoprost (XALATAN) 0.005 % ophthalmic solution Place 1 drop into both eyes at bedtime.    Marland Kitchen losartan (COZAAR) 50 MG tablet Take 50 mg by mouth daily.    . Omega-3 Fatty Acids (FISH OIL) 1000 MG CPDR Take by mouth.     No current facility-administered medications for this visit.     OBJECTIVE: Middle-aged African woman who appears well Vitals:   11/05/16 0915  BP: (!) 158/54  Pulse: 62  Resp: 18  Temp: 98.1 F (36.7 C)     Body mass index is 30.89 kg/m.    ECOG FS:0 - Asymptomatic  Sclerae unicteric, pupils round and equal Oropharynx clear and moist-- no thrush or other lesions No cervical or supraclavicular adenopathy Lungs no rales or rhonchi Heart regular rate and rhythm Abd soft, nontender, positive bowel sounds MSK no focal spinal tenderness, no upper extremity lymphedema Neuro: nonfocal, well oriented, appropriate affect Breasts: The right breast is benign. The left breast is status post lumpectomy and radiation. There is no evidence of local recurrence. There is an area of  induration over the incision consistent with scar tissue. The left axilla is benign.    LAB RESULTS:  CMP     Component Value Date/Time   NA 139 03/18/2016 1240   K 3.9 03/18/2016 1240   CL 102 04/04/2010 0236   CO2 27 03/18/2016 1240   GLUCOSE 108 03/18/2016 1240   BUN 15.9 03/18/2016 1240   CREATININE 1.1 03/18/2016 1240   CALCIUM 9.6 03/18/2016 1240   PROT 8.1 03/18/2016 1240   ALBUMIN 3.8 03/18/2016 1240   AST 20 03/18/2016 1240   ALT 16 03/18/2016 1240   ALKPHOS 63 03/18/2016 1240   BILITOT 0.63 03/18/2016 1240   GFRNONAA 57 (L) 04/04/2010 0236   GFRAA  04/04/2010 0236    >60        The eGFR has been calculated using the MDRD equation. This calculation has not been validated in all clinical situations. eGFR's persistently <60 mL/min signify possible Chronic Kidney Disease.    INo results found for: SPEP, UPEP  Lab Results  Component Value Date   WBC 6.3 11/05/2016   NEUTROABS 3.4 11/05/2016   HGB 12.4 11/05/2016   HCT 36.6 11/05/2016  MCV 90.1 11/05/2016   PLT 230 11/05/2016      Chemistry      Component Value Date/Time   NA 139 03/18/2016 1240   K 3.9 03/18/2016 1240   CL 102 04/04/2010 0236   CO2 27 03/18/2016 1240   BUN 15.9 03/18/2016 1240   CREATININE 1.1 03/18/2016 1240      Component Value Date/Time   CALCIUM 9.6 03/18/2016 1240   ALKPHOS 63 03/18/2016 1240   AST 20 03/18/2016 1240   ALT 16 03/18/2016 1240   BILITOT 0.63 03/18/2016 1240       No results found for: LABCA2  No components found for: LABCA125  No results for input(s): INR in the last 168 hours.  Urinalysis    Component Value Date/Time   COLORURINE YELLOW 04/04/2010 0228   APPEARANCEUR CLEAR 04/04/2010 0228   LABSPEC 1.021 04/04/2010 0228   PHURINE 8.5 (H) 04/04/2010 0228   GLUCOSEU NEGATIVE 04/04/2010 0228   HGBUR NEGATIVE 04/04/2010 0228   BILIRUBINUR NEGATIVE 04/04/2010 0228   KETONESUR NEGATIVE 04/04/2010 0228   PROTEINUR 30 (A) 04/04/2010 0228    UROBILINOGEN 0.2 04/04/2010 0228   NITRITE NEGATIVE 04/04/2010 0228   LEUKOCYTESUR SMALL (A) 04/04/2010 0228      ELIGIBLE FOR AVAILABLE RESEARCH PROTOCOL: No   STUDIES: No results found.   ASSESSMENT: 75 y.o. [place] woman Status post left breast lower inner quadrant biopsy 03/09/2016 for ductal carcinoma in situ, grade 1, estrogen and progesterone receptor positive  (1) status post left lumpectomy 04/15/2016 for a pT1b pNX, stage IA invasive ductal carcinoma measuring 0.7 cm, grade 2, with negative margins.  (2) adjuvant radiation completed 06/08/2016  (3) to start anastrozole 07/28/2016  (4) osteopenia, with a T score of -1.7 on bone density scan January 2017.  PLAN: Cindy Whitaker is tolerating the anastrozole well and the plan will be to continue that for a total of 5 years.  She has osteopenia. She has an excellent exercise program, and will continue going to the gym 4-5 times a week as soon as she recovers emotionally from the loss of her daughter.  She sees her primary care physician every 6 months. She is to see me again in May of next year, after her March mammogram. From that point if all is going well I will start seeing her on a once a year basis.  She knows to call for any problems that may develop before then.  Chauncey Cruel, MD   11/05/2016 9:28 AM Medical Oncology and Hematology [facility] [address] [place], [place] [address] Tel. [phone]    Fax. [phone]

## 2017-05-05 ENCOUNTER — Other Ambulatory Visit (HOSPITAL_BASED_OUTPATIENT_CLINIC_OR_DEPARTMENT_OTHER): Payer: Medicare Other

## 2017-05-05 ENCOUNTER — Ambulatory Visit (HOSPITAL_BASED_OUTPATIENT_CLINIC_OR_DEPARTMENT_OTHER): Payer: Medicare Other | Admitting: Oncology

## 2017-05-05 ENCOUNTER — Telehealth: Payer: Self-pay | Admitting: Oncology

## 2017-05-05 VITALS — BP 150/63 | HR 73 | Temp 98.1°F | Resp 18 | Ht 62.0 in | Wt 169.6 lb

## 2017-05-05 DIAGNOSIS — N951 Menopausal and female climacteric states: Secondary | ICD-10-CM

## 2017-05-05 DIAGNOSIS — Z170 Estrogen receptor positive status [ER+]: Secondary | ICD-10-CM | POA: Diagnosis not present

## 2017-05-05 DIAGNOSIS — M8580 Other specified disorders of bone density and structure, unspecified site: Secondary | ICD-10-CM | POA: Diagnosis not present

## 2017-05-05 DIAGNOSIS — N898 Other specified noninflammatory disorders of vagina: Secondary | ICD-10-CM | POA: Diagnosis not present

## 2017-05-05 DIAGNOSIS — C50312 Malignant neoplasm of lower-inner quadrant of left female breast: Secondary | ICD-10-CM

## 2017-05-05 DIAGNOSIS — D0512 Intraductal carcinoma in situ of left breast: Secondary | ICD-10-CM

## 2017-05-05 LAB — CBC WITH DIFFERENTIAL/PLATELET
BASO%: 0.7 % (ref 0.0–2.0)
BASOS ABS: 0.1 10*3/uL (ref 0.0–0.1)
EOS%: 1.4 % (ref 0.0–7.0)
Eosinophils Absolute: 0.1 10*3/uL (ref 0.0–0.5)
HEMATOCRIT: 36.6 % (ref 34.8–46.6)
HGB: 12.6 g/dL (ref 11.6–15.9)
LYMPH#: 2.4 10*3/uL (ref 0.9–3.3)
LYMPH%: 33.5 % (ref 14.0–49.7)
MCH: 31.2 pg (ref 25.1–34.0)
MCHC: 34.5 g/dL (ref 31.5–36.0)
MCV: 90.4 fL (ref 79.5–101.0)
MONO#: 0.7 10*3/uL (ref 0.1–0.9)
MONO%: 10.3 % (ref 0.0–14.0)
NEUT#: 3.8 10*3/uL (ref 1.5–6.5)
NEUT%: 54.1 % (ref 38.4–76.8)
PLATELETS: 227 10*3/uL (ref 145–400)
RBC: 4.05 10*6/uL (ref 3.70–5.45)
RDW: 13.1 % (ref 11.2–14.5)
WBC: 7.1 10*3/uL (ref 3.9–10.3)

## 2017-05-05 LAB — COMPREHENSIVE METABOLIC PANEL
ALT: 19 U/L (ref 0–55)
ANION GAP: 7 meq/L (ref 3–11)
AST: 18 U/L (ref 5–34)
Albumin: 3.9 g/dL (ref 3.5–5.0)
Alkaline Phosphatase: 71 U/L (ref 40–150)
BUN: 14.7 mg/dL (ref 7.0–26.0)
CALCIUM: 9.7 mg/dL (ref 8.4–10.4)
CHLORIDE: 108 meq/L (ref 98–109)
CO2: 27 mEq/L (ref 22–29)
Creatinine: 1 mg/dL (ref 0.6–1.1)
EGFR: 53 mL/min/{1.73_m2} — ABNORMAL LOW (ref >90)
Glucose: 113 mg/dl (ref 70–140)
POTASSIUM: 4.4 meq/L (ref 3.5–5.1)
Sodium: 141 mEq/L (ref 136–145)
Total Bilirubin: 0.62 mg/dL (ref 0.20–1.20)
Total Protein: 7.9 g/dL (ref 6.4–8.3)

## 2017-05-05 MED ORDER — ANASTROZOLE 1 MG PO TABS: 1.0000 mg | ORAL_TABLET | Freq: Every day | ORAL | 4 refills | Status: DC

## 2017-05-05 NOTE — Telephone Encounter (Signed)
sch one year follow up per LOS. Pt will contact PCP to get bone density done in March 2019

## 2017-05-05 NOTE — Progress Notes (Signed)
[place]  Telephone:(336) [phone] Fax:(336) [phone]     ID: Cindy Whitaker DOB: 08/15/1941  MR#: [id number]  JSE#:[id number]  Patient Care Team: Haywood Pao, MD as PCP - General (Internal Medicine) , Virgie Dad, MD as Consulting Physician (Oncology) Erroll Luna, MD as Consulting Physician (General Surgery) Eppie Gibson, MD as Attending Physician (Radiation Oncology) Juanita Craver, MD as Consulting Physician (Gastroenterology) Iven Finn, DMD (Dentistry) Cherlyn Roberts, OD (Optometry) PCP: Haywood Pao, MD OTHER MD:  CHIEF COMPLAINT: Invasive ductal carcinoma, estrogen receptor positive  CURRENT TREATMENT:  anastrozole   BREAST CANCER HISTORY: From the original intake note:  The patient had routine screening mammography at [facility] 03/02/2016 with tomosynthesis. This showed the breast density to be category be. In the left breast lower inner quadrant there was a new oval mass and ultrasound performed the next day confirmed a 9 mm lobulated mass which was hypoechoic and showed increased vascularity. The left axilla was sonographically benign.  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  Her subsequent history is as detailed below  INTERVAL HISTORY: Cindy Whitaker returns today for follow-up of her estrogen receptor positive breast cancer. The interval history is generally unremarkable. She continues on anastrozole, with good tolerance. She has mild hot flashes and mild to moderate vaginal dryness problems. She is currently obtaining the drug at no cost  REVIEW OF SYSTEMS: She exercises regularly at the gym about 5 days weekly. She has occasional joint pains here and there. Rarely she has blurred vision. A  detailed review of systems today was otherwise stable  PAST MEDICAL HISTORY: Past Medical History:  Diagnosis Date  . Breast cancer ([place])   . Breast cancer of lower-inner quadrant of left female breast ([place]) 03/13/2016  . Glaucoma   . Hx of radiation therapy 05/22/16- 06/12/16   Left Breast  . Hyperlipidemia   . Hypertension   . Osteoporosis     PAST SURGICAL HISTORY: Past Surgical History:  Procedure Laterality Date  . BREAST LUMPECTOMY WITH RADIOACTIVE SEED LOCALIZATION Left 04/15/2016   Procedure: BREAST LUMPECTOMY WITH RADIOACTIVE SEED LOCALIZATION;  Surgeon: Erroll Luna, MD;  Location: [place];  Service: General;  Laterality: Left;  . FOOT SURGERY Right     FAMILY HISTORY No family history on file. The patient's father died at the age of 24, from "old age" the patient's mother died from tuberculosis at the age of 71. The patient has 3 brothers, 5 sisters. There is no history of breast or ovarian cancer in the family to her knowledge  GYNECOLOGIC HISTORY:  No LMP recorded. Patient is postmenopausal. Menarche age 61, first live birth age 81. The patient is GX P3. She stopped having periods in April 1995. She did not take hormone replacement. She never used oral contraceptives.  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  Her husband Cindy Whitaker is a retired Education officer, museum. Their daughter Cindy Whitaker lives in [place] and is a Programmer, systems; daughter Cindy Whitaker Lives in [place] where she works as a Research scientist (physical sciences). Son Cindy Whitaker this in [place] where he works as a Government social research officer. The patient has 3 grandchildren. She attends a local [place] [place]:  Not in place   HEALTH MAINTENANCE: Social History  Substance Use Topics  . Smoking status: Never Smoker  . Smokeless tobacco: Not on file  . Alcohol use No      Colonoscopy:09/17/2014/[facility]  PAP: 2014  Bone density: 01/28/2016, lowest T score -1.7   Lipid panel:  No Known Allergies  Current Outpatient Prescriptions  Medication Sig Dispense Refill  . amLODipine (NORVASC) 5 MG tablet TK 1 T PO QD  5  . anastrozole (ARIMIDEX) 1 MG tablet Take 1 tablet (1 mg total) by mouth daily. 90 tablet 4  . Ascorbic Acid (VITAMIN C) 100 MG tablet Take 600 mg by mouth daily.    Marland Kitchen atorvastatin (LIPITOR) 10 MG tablet Take 10 mg by mouth daily.    . Calcium Carbonate (CALCIUM 600 HIGH POTENCY PO) Take by mouth.    . Cholecalciferol (VITAMIN D3) 5000 units CAPS Take by mouth.    . dorzolamide-timolol (COSOPT) 22.3-6.8 MG/ML ophthalmic solution Place 1 drop into the left eye 2 (two) times daily.    . Flaxseed, Linseed, (FLAXSEED OIL) 1000 MG CAPS Take by mouth.    Marland Kitchen ibuprofen (ADVIL,MOTRIN) 200 MG tablet Take 200 mg by mouth every 6 (six) hours as needed.    . latanoprost (XALATAN) 0.005 % ophthalmic solution Place 1 drop into both eyes at bedtime.    Marland Kitchen losartan (COZAAR) 50 MG tablet Take 50 mg by mouth daily.    . Omega-3 Fatty Acids (FISH OIL) 1000 MG CPDR Take by mouth.     No current facility-administered medications for this visit.     OBJECTIVE: Middle-aged African woman In no acute distress Vitals:   05/05/17 0958  BP: (!) 150/63  Pulse: 73  Resp: 18  Temp: 98.1 F (36.7 C)     Body mass index is 31.02 kg/m.    ECOG FS:0 - Asymptomatic  Sclerae unicteric, EOMs intact Oropharynx clear and moist No cervical or supraclavicular adenopathy Lungs no rales or rhonchi Heart regular rate and rhythm Abd soft, nontender, positive bowel sounds MSK no focal spinal tenderness, no upper extremity lymphedema Neuro: nonfocal, well oriented, appropriate affect Breasts: The right breast is unremarkable. The left breast has undergone lumpectomy and radiation. There is some scar tissue associated with the incision medially. There is no evidence of disease  recurrence. Both axillae are benign.  LAB RESULTS:  CMP     Component Value Date/Time   NA 141 05/05/2017 0933   K 4.4 05/05/2017 0933   CL 102 04/04/2010 0236   CO2 27 05/05/2017 0933   GLUCOSE 113 05/05/2017 0933   BUN 14.7 05/05/2017 0933   CREATININE 1.0 05/05/2017 0933   CALCIUM 9.7 05/05/2017 0933   PROT 7.9 05/05/2017 0933   ALBUMIN 3.9 05/05/2017 0933   AST 18 05/05/2017 0933   ALT 19 05/05/2017 0933   ALKPHOS 71 05/05/2017 0933   BILITOT 0.62 05/05/2017 0933   GFRNONAA 57 (L) 04/04/2010 0236   GFRAA  04/04/2010 0236    >60        The eGFR has been calculated using the MDRD equation. This calculation has not been validated in all clinical situations. eGFR's persistently <60 mL/min signify possible Chronic Kidney Disease.    INo results found for: SPEP, UPEP  Lab Results  Component Value Date   WBC 7.1 05/05/2017   NEUTROABS 3.8 05/05/2017   HGB 12.6 05/05/2017   HCT 36.6 05/05/2017   MCV 90.4 05/05/2017   PLT 227 05/05/2017      Chemistry  Component Value Date/Time   NA 141 05/05/2017 0933   K 4.4 05/05/2017 0933   CL 102 04/04/2010 0236   CO2 27 05/05/2017 0933   BUN 14.7 05/05/2017 0933   CREATININE 1.0 05/05/2017 0933      Component Value Date/Time   CALCIUM 9.7 05/05/2017 0933   ALKPHOS 71 05/05/2017 0933   AST 18 05/05/2017 0933   ALT 19 05/05/2017 0933   BILITOT 0.62 05/05/2017 0933       No results found for: LABCA2  No components found for: LABCA125  No results for input(s): INR in the last 168 hours.  Urinalysis    Component Value Date/Time   COLORURINE YELLOW 04/04/2010 0228   APPEARANCEUR CLEAR 04/04/2010 0228   LABSPEC 1.021 04/04/2010 0228   PHURINE 8.5 (H) 04/04/2010 0228   GLUCOSEU NEGATIVE 04/04/2010 0228   HGBUR NEGATIVE 04/04/2010 0228   BILIRUBINUR NEGATIVE 04/04/2010 0228   KETONESUR NEGATIVE 04/04/2010 0228   PROTEINUR 30 (A) 04/04/2010 0228   UROBILINOGEN 0.2 04/04/2010 0228   NITRITE NEGATIVE  04/04/2010 0228   LEUKOCYTESUR SMALL (A) 04/04/2010 0228      ELIGIBLE FOR AVAILABLE RESEARCH PROTOCOL: No   STUDIES: Mammography at [facility] 03/16/2017 found a breast density to be category B. There was no evidence of malignancy.  ASSESSMENT: 76 y.o. [place] woman Status post left breast lower inner quadrant biopsy 03/09/2016 for ductal carcinoma in situ, grade 1, estrogen and progesterone receptor positive  (1) status post left lumpectomy 04/15/2016 for a pT1b pNX, stage IA invasive ductal carcinoma measuring 0.7 cm, grade 2, with negative margins.  (2) adjuvant radiation completed 06/08/2016  (3) to start anastrozole 07/28/2016  (4) osteopenia, with a T score of -1.7 on bone density scan January 2017.  PLAN: Carlin is now one year out from definitive surgery for her breast cancer with no evidence of disease recurrence. This is  favorable.  She is tolerating the anastrozole without any unusual side effects. She is not interested in referral to our intimacy and pelvic health program at this point  The plan will be to continue anastrozole for total of 5 years. We will repeat a bone density with her next mammogram March 2019  She will see me again shortly after that. She knows to call for any problems that may develop before that visit    Chauncey Cruel, MD   05/05/2017 10:11 AM Medical Oncology and Hematology [facility] [place],  [address] Tel. [phone]    Fax. [phone]

## 2017-05-06 LAB — VITAMIN D 25 HYDROXY (VIT D DEFICIENCY, FRACTURES): VIT D 25 HYDROXY: 39.5 ng/mL (ref 30.0–100.0)

## 2018-03-15 ENCOUNTER — Encounter: Payer: Self-pay | Admitting: Oncology

## 2018-04-14 ENCOUNTER — Telehealth: Payer: Self-pay | Admitting: Oncology

## 2018-04-14 NOTE — Telephone Encounter (Signed)
Called pt re appts that were moved due to GM pal - attempted to leave a vm but pt did not have a voicemail set up.

## 2018-05-05 ENCOUNTER — Ambulatory Visit: Payer: Medicare Other | Admitting: Oncology

## 2018-05-05 ENCOUNTER — Other Ambulatory Visit: Payer: Medicare Other

## 2018-05-17 ENCOUNTER — Other Ambulatory Visit: Payer: Self-pay | Admitting: Oncology

## 2018-05-19 NOTE — Progress Notes (Signed)
[place]  [phone]) [phone] Fax:(336) [phone]     ID: Cindy Whitaker DOB: Feb 02, 1941  MR#: [id number]  XAJ#:[id number]  Patient Care Team: Haywood Pao, MD as PCP - General (Internal Medicine) Magrinat, Virgie Dad, MD as Consulting Physician (Oncology) Erroll Luna, MD as Consulting Physician (General Surgery) Eppie Gibson, MD as Attending Physician (Radiation Oncology) Juanita Craver, MD as Consulting Physician (Gastroenterology) Iven Finn, DMD (Dentistry) Cherlyn Roberts, OD (Optometry) PCP: Haywood Pao, MD OTHER MD:  CHIEF COMPLAINT: Invasive ductal carcinoma, estrogen receptor positive  CURRENT TREATMENT:  anastrozole   BREAST CANCER HISTORY: From the original intake note:  The patient had routine screening mammography at [facility] 03/02/2016 with tomosynthesis. This showed the breast density to be category be. In the left breast lower inner quadrant there was a new oval mass and ultrasound performed the next day confirmed a 9 mm lobulated mass which was hypoechoic and showed increased vascularity. The left axilla was sonographically benign.  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  Her subsequent history is as detailed below  INTERVAL HISTORY: Cindy Whitaker returns today for a follow-up and treatment of her invasive ductal carcinoma, estrogen receptor positive.   The patient continues on anastrozole, which she tolerates well. She endorses hot flashes, that will occasionally keep her up at night. She also has vaginal dryness.  Mamm[facility] at [facility] 03/16/2018 density to be category B.  There was no evidence of malignancy.  REVIEW OF SYSTEMS: Cindy Whitaker is doing well. About 1 year ago, May 2018, she had  cataract surgery that went well.  Stating she is abl to see better. The patient denies unusual headaches, nausea, vomiting, or dizziness. There has been no unusual cough, phlegm production, or pleurisy. This been no change in bowel or bladder habits. The patient denies unexplained fatigue or unexplained weight loss, bleeding, rash, or fever. A detailed review of systems was otherwise noncontributory.   PAST MEDICAL HISTORY: Past Medical History:  Diagnosis Date  . Breast cancer ([place])   . Breast cancer of lower-inner quadrant of left female breast ([place]) 03/13/2016  . Glaucoma   . Hx of radiation therapy 05/22/16- 06/12/16   Left Breast  . Hyperlipidemia   . Hypertension   . Osteoporosis     PAST SURGICAL HISTORY: Past Surgical History:  Procedure Laterality Date  . BREAST LUMPECTOMY WITH RADIOACTIVE SEED LOCALIZATION Left 04/15/2016   Procedure: BREAST LUMPECTOMY WITH RADIOACTIVE SEED LOCALIZATION;  Surgeon: Erroll Luna, MD;  Location: [place];  Service: General;  Laterality: Left;  . FOOT SURGERY Right     FAMILY HISTORY No family history on file. The patient's father died at the age of 23, from "old age" the patient's mother died from tuberculosis at the age of 42. The patient has 3 brothers, 5 sisters. There is no history of breast or ovarian cancer in the family to her knowledge  GYNECOLOGIC HISTORY:  No LMP recorded. Patient is postmenopausal. Menarche age 43, first live birth age 79. The patient is GX P3. She stopped having periods in April 1995. She did not take hormone replacement. She never used oral contraceptives.  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  Her husband [name] is a retired Education officer, museum. Their  daughter [name] lives in [place] and is a Programmer, systems; daughter Marcayla Budge Lives in [place] where she works as a Research scientist (physical sciences). Son Jahira Swiss this  in [place] where he works as a Government social research officer. The patient has 3 grandchildren. She attends a local [employer]: Not in place   HEALTH MAINTENANCE: Social History   Tobacco Use  . Smoking status: Never Smoker  Substance Use Topics  . Alcohol use: No  . Drug use: No     Colonoscopy:09/17/2014/[facility]  PAP: 2014  Bone density: 01/28/2016, lowest T score -1.7   Lipid panel:  No Known Allergies  Current Outpatient Medications  Medication Sig Dispense Refill  . amLODipine (NORVASC) 5 MG tablet TK 1 T PO QD  5  . anastrozole (ARIMIDEX) 1 MG tablet TAKE 1 TABLET BY MOUTH  DAILY 90 tablet 4  . Ascorbic Acid (VITAMIN C) 100 MG tablet Take 600 mg by mouth daily.    Marland Kitchen atorvastatin (LIPITOR) 10 MG tablet Take 10 mg by mouth daily.    . Calcium Carbonate (CALCIUM 600 HIGH POTENCY PO) Take by mouth.    . Cholecalciferol (VITAMIN D3) 5000 units CAPS Take by mouth.    . dorzolamide-timolol (COSOPT) 22.3-6.8 MG/ML ophthalmic solution Place 1 drop into the left eye 2 (two) times daily.    . Flaxseed, Linseed, (FLAXSEED OIL) 1000 MG CAPS Take by mouth.    Marland Kitchen ibuprofen (ADVIL,MOTRIN) 200 MG tablet Take 200 mg by mouth every 6 (six) hours as needed.    . latanoprost (XALATAN) 0.005 % ophthalmic solution Place 1 drop into both eyes at bedtime.    Marland Kitchen losartan (COZAAR) 50 MG tablet Take 50 mg by mouth daily.    . Omega-3 Fatty Acids (FISH OIL) 1000 MG CPDR Take by mouth.     No current facility-administered medications for this visit.     OBJECTIVE: Middle-aged African woman who appears well Vitals:   05/20/18 1312  BP: (!) 150/62  Pulse: 65  Resp: 18  Temp: 98.6 F (37 C)  SpO2: 100%     Body mass index is 30.14 kg/m.    ECOG FS:0 - Asymptomatic  Sclerae unicteric, pupils round and equal Oropharynx clear and moist No cervical or supraclavicular adenopathy Lungs no rales or rhonchi Heart regular rate and rhythm Abd soft, nontender, positive bowel  sounds MSK no focal spinal tenderness, no upper extremity lymphedema Neuro: nonfocal, well oriented, appropriate affect Breasts: The right breast is benign.  On the left the patient has undergone lumpectomy followed by radiation, with minimal distortion of the breast contour.  There is no evidence of recurrence.  Both axillae are benign.  LAB RESULTS:  CMP     Component Value Date/Time   NA 141 05/05/2017 0933   K 4.4 05/05/2017 0933   CL 102 04/04/2010 0236   CO2 27 05/05/2017 0933   GLUCOSE 113 05/05/2017 0933   BUN 14.7 05/05/2017 0933   CREATININE 1.0 05/05/2017 0933   CALCIUM 9.7 05/05/2017 0933   PROT 7.9 05/05/2017 0933   ALBUMIN 3.9 05/05/2017 0933   AST 18 05/05/2017 0933   ALT 19 05/05/2017 0933   ALKPHOS 71 05/05/2017 0933   BILITOT 0.62 05/05/2017 0933   GFRNONAA 57 (L) 04/04/2010 0236   GFRAA  04/04/2010 0236    >60        The eGFR has been calculated using the MDRD equation. This calculation has not been validated in  all clinical situations. eGFR's persistently <60 mL/min signify possible Chronic Kidney Disease.    INo results found for: SPEP, UPEP  Lab Results  Component Value Date   WBC 8.2 05/20/2018   NEUTROABS 3.7 05/20/2018   HGB 11.9 05/20/2018   HCT 35.1 05/20/2018   MCV 89.0 05/20/2018   PLT 219 05/20/2018      Chemistry      Component Value Date/Time   NA 141 05/05/2017 0933   K 4.4 05/05/2017 0933   CL 102 04/04/2010 0236   CO2 27 05/05/2017 0933   BUN 14.7 05/05/2017 0933   CREATININE 1.0 05/05/2017 0933      Component Value Date/Time   CALCIUM 9.7 05/05/2017 0933   ALKPHOS 71 05/05/2017 0933   AST 18 05/05/2017 0933   ALT 19 05/05/2017 0933   BILITOT 0.62 05/05/2017 0933       No results found for: LABCA2  No components found for: LABCA125  No results for input(s): INR in the last 168 hours.  Urinalysis    Component Value Date/Time   COLORURINE YELLOW 04/04/2010 0228   APPEARANCEUR CLEAR 04/04/2010 0228    LABSPEC 1.021 04/04/2010 0228   PHURINE 8.5 (H) 04/04/2010 0228   GLUCOSEU NEGATIVE 04/04/2010 0228   HGBUR NEGATIVE 04/04/2010 0228   BILIRUBINUR NEGATIVE 04/04/2010 0228   KETONESUR NEGATIVE 04/04/2010 0228   PROTEINUR 30 (A) 04/04/2010 0228   UROBILINOGEN 0.2 04/04/2010 0228   NITRITE NEGATIVE 04/04/2010 0228   LEUKOCYTESUR SMALL (A) 04/04/2010 0228      ELIGIBLE FOR AVAILABLE RESEARCH PROTOCOL: No   STUDIES: Bone density at [facility] medical  ASSESSMENT: 77 y.o. Cindy Whitaker woman Status post left breast lower inner quadrant biopsy 03/09/2016 for ductal carcinoma in situ, grade 1, estrogen and progesterone receptor positive  (1) status post left lumpectomy 04/15/2016 for a pT1b pNX, stage IA invasive ductal carcinoma measuring 0.7 cm, grade 2, with negative margins.  (2) adjuvant radiation completed 06/08/2016  (3) started anastrozole 07/28/2016  (4) osteopenia, with a T score of -1.7 on bone density scan January 2017.  PLAN: Cindy Whitaker is now 2 years out from definitive surgery for breast cancer with no evidence of disease recurrence.  This is very favorable.  She is tolerating anastrozole well.  The plan is to continue that for a total of 5 years.  We are requesting her bone density results from [place].  She had that 2 months ago and understands that it was "okay".  She will return to see me in 1 year.  She knows to call for any other issues that may develop before that visit.  Magrinat, Virgie Dad, MD  05/20/18 1:28 PM Medical Oncology and Hematology [facility] [address] [place], [place] [address] Tel. [phone]    Fax. [phone]  This document serves as a record of services personally performed by Chauncey Cruel, MD. It was created on his behalf by Margit Banda, a trained medical scribe. The creation of this record is based on the scribe's personal observations and the provider's statements to them.   I have reviewed the  above documentation for accuracy and completeness, and I agree with the above.

## 2018-05-20 ENCOUNTER — Inpatient Hospital Stay: Payer: Medicare Other | Attending: Oncology | Admitting: Oncology

## 2018-05-20 ENCOUNTER — Inpatient Hospital Stay: Payer: Medicare Other

## 2018-05-20 ENCOUNTER — Telehealth: Payer: Self-pay | Admitting: Oncology

## 2018-05-20 VITALS — BP 150/62 | HR 65 | Temp 98.6°F | Resp 18 | Ht 62.0 in | Wt 164.8 lb

## 2018-05-20 DIAGNOSIS — C50312 Malignant neoplasm of lower-inner quadrant of left female breast: Secondary | ICD-10-CM

## 2018-05-20 DIAGNOSIS — M8580 Other specified disorders of bone density and structure, unspecified site: Secondary | ICD-10-CM | POA: Diagnosis not present

## 2018-05-20 DIAGNOSIS — N951 Menopausal and female climacteric states: Secondary | ICD-10-CM | POA: Diagnosis not present

## 2018-05-20 DIAGNOSIS — N898 Other specified noninflammatory disorders of vagina: Secondary | ICD-10-CM | POA: Diagnosis not present

## 2018-05-20 DIAGNOSIS — Z170 Estrogen receptor positive status [ER+]: Secondary | ICD-10-CM | POA: Diagnosis not present

## 2018-05-20 DIAGNOSIS — D0512 Intraductal carcinoma in situ of left breast: Secondary | ICD-10-CM | POA: Insufficient documentation

## 2018-05-20 LAB — COMPREHENSIVE METABOLIC PANEL
ALBUMIN: 4 g/dL (ref 3.5–5.0)
ALK PHOS: 69 U/L (ref 38–126)
ALT: 18 U/L (ref 14–54)
AST: 19 U/L (ref 15–41)
Anion gap: 10 (ref 5–15)
BUN: 17 mg/dL (ref 6–20)
CALCIUM: 9.6 mg/dL (ref 8.9–10.3)
CO2: 25 mmol/L (ref 22–32)
CREATININE: 0.91 mg/dL (ref 0.44–1.00)
Chloride: 105 mmol/L (ref 101–111)
GFR calc non Af Amer: >60 mL/min (ref >60)
Glucose, Bld: 82 mg/dL (ref 65–99)
Potassium: 4.6 mmol/L (ref 3.5–5.1)
SODIUM: 140 mmol/L (ref 135–145)
TOTAL PROTEIN: 8 g/dL (ref 6.5–8.1)
Total Bilirubin: 0.6 mg/dL (ref 0.3–1.2)

## 2018-05-20 LAB — CBC WITH DIFFERENTIAL/PLATELET
BASOS PCT: 1 %
Basophils Absolute: 0 10*3/uL (ref 0.0–0.1)
EOS ABS: 0.1 10*3/uL (ref 0.0–0.5)
EOS PCT: 2 %
HCT: 35.1 % (ref 34.8–46.6)
Hemoglobin: 11.9 g/dL (ref 11.6–15.9)
Lymphocytes Relative: 42 %
Lymphs Abs: 3.4 10*3/uL — ABNORMAL HIGH (ref 0.9–3.3)
MCH: 30.1 pg (ref 25.1–34.0)
MCHC: 33.9 g/dL (ref 31.5–36.0)
MCV: 89 fL (ref 79.5–101.0)
Monocytes Absolute: 0.8 10*3/uL (ref 0.1–0.9)
Monocytes Relative: 10 %
Neutro Abs: 3.7 10*3/uL (ref 1.5–6.5)
Neutrophils Relative %: 45 %
PLATELETS: 219 10*3/uL (ref 145–400)
RBC: 3.94 MIL/uL (ref 3.70–5.45)
RDW: 13.7 % (ref 11.2–14.5)
WBC: 8.2 10*3/uL (ref 3.9–10.3)

## 2018-05-20 NOTE — Telephone Encounter (Signed)
Gave avs and calendar ° °

## 2019-05-18 ENCOUNTER — Telehealth: Payer: Self-pay | Admitting: Oncology

## 2019-05-18 NOTE — Telephone Encounter (Signed)
Called patient regarding upcoming Webex appointment, per patient's request this will be a telephone visit.  °

## 2019-05-19 ENCOUNTER — Telehealth: Payer: Self-pay | Admitting: Oncology

## 2019-05-19 NOTE — Telephone Encounter (Signed)
Contacted pt to verify telephone visit for pre reg °

## 2019-05-23 ENCOUNTER — Encounter: Payer: Self-pay | Admitting: Oncology

## 2019-05-23 ENCOUNTER — Other Ambulatory Visit: Payer: Medicare Other

## 2019-05-23 ENCOUNTER — Inpatient Hospital Stay: Payer: Medicare Other | Attending: Oncology | Admitting: Oncology

## 2019-05-23 DIAGNOSIS — Z79810 Long term (current) use of selective estrogen receptor modulators (SERMs): Secondary | ICD-10-CM

## 2019-05-23 DIAGNOSIS — I10 Essential (primary) hypertension: Secondary | ICD-10-CM | POA: Diagnosis not present

## 2019-05-23 DIAGNOSIS — Z923 Personal history of irradiation: Secondary | ICD-10-CM

## 2019-05-23 DIAGNOSIS — Z79899 Other long term (current) drug therapy: Secondary | ICD-10-CM

## 2019-05-23 DIAGNOSIS — C50312 Malignant neoplasm of lower-inner quadrant of left female breast: Secondary | ICD-10-CM

## 2019-05-23 DIAGNOSIS — Z170 Estrogen receptor positive status [ER+]: Secondary | ICD-10-CM

## 2019-05-23 MED ORDER — ANASTROZOLE 1 MG PO TABS: 1.0000 mg | ORAL_TABLET | Freq: Every day | ORAL | 4 refills | Status: DC

## 2019-05-23 NOTE — Progress Notes (Signed)
[name]  Telephone:(336) [phone] Fax:(336) [phone]     ID: Cindy Whitaker DOB: August 02, 1941  MR#: [id number]  CHE#:[id number]  Patient Care Team: Haywood Pao, MD as PCP - General (Internal Medicine) Magrinat, Virgie Dad, MD as Consulting Physician (Oncology) Erroll Luna, MD as Consulting Physician (General Surgery) Eppie Gibson, MD as Attending Physician (Radiation Oncology) Juanita Craver, MD as Consulting Physician (Gastroenterology) Iven Finn, DMD (Dentistry) Cherlyn Roberts, OD (Optometry) OTHER MD:  I connected with Cindy Whitaker on 05/23/19 at  3:00 PM EDT by telephone visit and verified that I am speaking with the correct person using two identifiers.   I discussed the limitations, risks, security and privacy concerns of performing an evaluation and management service by telemedicine and the availability of in-person appointments. I also discussed with the patient that there may be a patient responsible charge related to this service. The patient expressed understanding and agreed to proceed.   Other persons participating in the visit and their role in the encounter: Wilburn Mylar, scribe   Patient's location: home  Provider's location: [place]: Invasive ductal carcinoma, estrogen receptor positive  CURRENT TREATMENT:  anastrozole   INTERVAL HISTORY: Cindy Whitaker returns today for a follow-up and treatment of her invasive ductal carcinoma, estrogen receptor positive.   The patient continues on anastrozole, which she tolerates well. She reports some hot flashes and denies any other symptoms. She does not have to pay for it.   Since her last visit, she underwent bilateral diagnostic mammography with tomography at [facility] on 03/21/2019 showing: breast density category B; no evidence of malignancy in either breast.   REVIEW OF SYSTEMS: Illene reports doing well overall. She spends her time cleaning her house  and taking care of herself. Her husband does the shopping. She walks around her house or does some exercises inside for exercise. The patient denies unusual headaches, visual changes, nausea, vomiting, stiff neck, dizziness, or gait imbalance. There has been no cough, phlegm production, or pleurisy, no chest pain or pressure, and no change in bowel or bladder habits. The patient denies fever, rash, bleeding, unexplained fatigue or unexplained weight loss. A detailed review of systems was otherwise entirely negative.   BREAST CANCER HISTORY: From the original intake note:  The patient had routine screening mammography at [facility] 03/02/2016 with tomosynthesis. This showed the breast density to be category be. In the left breast lower inner quadrant there was a new oval mass and ultrasound performed the next day confirmed a 9 mm lobulated mass which was hypoechoic and showed increased vascularity. The left axilla was sonographically benign.  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  Her subsequent history is as detailed below   PAST MEDICAL HISTORY: Past Medical History:  Diagnosis Date  . Breast cancer ([place])   . Breast cancer of lower-inner quadrant of left female breast ([place]) 03/13/2016  . Glaucoma   . Hx of radiation therapy 05/22/16- 06/12/16   Left Breast  . Hyperlipidemia   . Hypertension   . Osteoporosis     PAST SURGICAL HISTORY: Past Surgical History:  Procedure Laterality Date  . BREAST LUMPECTOMY WITH RADIOACTIVE SEED LOCALIZATION Left 04/15/2016   Procedure: BREAST LUMPECTOMY WITH RADIOACTIVE SEED LOCALIZATION;  Surgeon: Erroll Luna, MD;  Location: [facility];  Service: General;  Laterality: Left;  . FOOT SURGERY Right      FAMILY HISTORY Family History  Problem Relation Age of Onset  . Tuberculosis Mother    The patient's father died at the age of 24, from "old age" the patient's mother died from tuberculosis at the age of 64. The patient has 3 brothers, 5 sisters. There is no history of breast or ovarian cancer in the family to her knowledge  GYNECOLOGIC HISTORY:  No LMP recorded. Patient is postmenopausal. Menarche age 89, first live birth age 79. The patient is GX P3. She stopped having periods in April 1995. She did not take hormone replacement. She never used oral contraceptives.  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  Her husband [name] is a retired Education officer, museum. Their daughter [name] lives in [place] and is a Programmer, systems; daughter Bobbette Eakes Lives in [place] where she works as a Research scientist (physical sciences). Son Anabeth Chilcott this in [place] where he works as a Government social research officer. The patient has 3 grandchildren. She attends a local [employer]: Not in place   HEALTH MAINTENANCE: Social History   Tobacco Use  . Smoking status: Never Smoker  Substance Use Topics  . Alcohol use: No  . Drug use: No     Colonoscopy:09/17/2014/[facility]  PAP: 2014  Bone density: 01/28/2016, lowest T score -1.7   Lipid panel:  No Known Allergies  Current Outpatient Medications  Medication Sig Dispense Refill  . amLODipine (NORVASC) 5 MG tablet TK 1 T PO QD  5  . anastrozole (ARIMIDEX) 1 MG tablet Take 1 tablet (1 mg total) by mouth daily. 90 tablet 4  . Ascorbic Acid (VITAMIN C) 100 MG tablet Take 600 mg by mouth daily.    . Calcium Carbonate (CALCIUM 600 HIGH POTENCY PO) Take by mouth.    . Cholecalciferol (VITAMIN D3) 5000 units CAPS Take by mouth.    . ezetimibe (ZETIA) 10 MG tablet Take 1 tablet (10 mg total) by mouth daily.    . Flaxseed, Linseed, (FLAXSEED OIL) 1000 MG CAPS Take by  mouth.    Marland Kitchen ibuprofen (ADVIL,MOTRIN) 200 MG tablet Take 200 mg by mouth every 6 (six) hours as needed.    . latanoprost (XALATAN) 0.005 % ophthalmic solution Place 1 drop into both eyes at bedtime.    Marland Kitchen losartan (COZAAR) 50 MG tablet Take 50 mg by mouth daily.    . Omega-3 Fatty Acids (FISH OIL) 1000 MG CPDR Take by mouth.     No current facility-administered medications for this visit.     OBJECTIVE: Middle-aged African woman in no acute distress There were no vitals filed for this visit.   There is no height or weight on file to calculate BMI.    ECOG FS:1 - Symptomatic but completely ambulatory  LAB RESULTS:  CMP     Component Value Date/Time   NA 140 05/20/2018 1252   NA 141 05/05/2017 0933   K 4.6 05/20/2018 1252   K 4.4 05/05/2017 0933   CL 105 05/20/2018 1252   CO2 25 05/20/2018 1252   CO2 27 05/05/2017 0933   GLUCOSE 82 05/20/2018 1252   GLUCOSE 113 05/05/2017 0933   BUN 17 05/20/2018 1252   BUN 14.7 05/05/2017 0933   CREATININE 0.91 05/20/2018 1252   CREATININE 1.0 05/05/2017 0933   CALCIUM 9.6 05/20/2018  1252   CALCIUM 9.7 05/05/2017 0933   PROT 8.0 05/20/2018 1252   PROT 7.9 05/05/2017 0933   ALBUMIN 4.0 05/20/2018 1252   ALBUMIN 3.9 05/05/2017 0933   AST 19 05/20/2018 1252   AST 18 05/05/2017 0933   ALT 18 05/20/2018 1252   ALT 19 05/05/2017 0933   ALKPHOS 69 05/20/2018 1252   ALKPHOS 71 05/05/2017 0933   BILITOT 0.6 05/20/2018 1252   BILITOT 0.62 05/05/2017 0933   GFRNONAA >60 05/20/2018 1252   GFRAA >60 05/20/2018 1252    INo results found for: SPEP, UPEP  Lab Results  Component Value Date   WBC 8.2 05/20/2018   NEUTROABS 3.7 05/20/2018   HGB 11.9 05/20/2018   HCT 35.1 05/20/2018   MCV 89.0 05/20/2018   PLT 219 05/20/2018      Chemistry      Component Value Date/Time   NA 140 05/20/2018 1252   NA 141 05/05/2017 0933   K 4.6 05/20/2018 1252   K 4.4 05/05/2017 0933   CL 105 05/20/2018 1252   CO2 25 05/20/2018 1252   CO2 27 05/05/2017  0933   BUN 17 05/20/2018 1252   BUN 14.7 05/05/2017 0933   CREATININE 0.91 05/20/2018 1252   CREATININE 1.0 05/05/2017 0933      Component Value Date/Time   CALCIUM 9.6 05/20/2018 1252   CALCIUM 9.7 05/05/2017 0933   ALKPHOS 69 05/20/2018 1252   ALKPHOS 71 05/05/2017 0933   AST 19 05/20/2018 1252   AST 18 05/05/2017 0933   ALT 18 05/20/2018 1252   ALT 19 05/05/2017 0933   BILITOT 0.6 05/20/2018 1252   BILITOT 0.62 05/05/2017 0933       No results found for: LABCA2  No components found for: LABCA125  No results for input(s): INR in the last 168 hours.  Urinalysis    Component Value Date/Time   COLORURINE YELLOW 04/04/2010 0228   APPEARANCEUR CLEAR 04/04/2010 0228   LABSPEC 1.021 04/04/2010 0228   PHURINE 8.5 (H) 04/04/2010 0228   GLUCOSEU NEGATIVE 04/04/2010 0228   HGBUR NEGATIVE 04/04/2010 0228   BILIRUBINUR NEGATIVE 04/04/2010 0228   KETONESUR NEGATIVE 04/04/2010 0228   PROTEINUR 30 (A) 04/04/2010 0228   UROBILINOGEN 0.2 04/04/2010 0228   NITRITE NEGATIVE 04/04/2010 0228   LEUKOCYTESUR SMALL (A) 04/04/2010 0228      ELIGIBLE FOR AVAILABLE RESEARCH PROTOCOL: No   STUDIES: No results found.   ASSESSMENT: 78 y.o. [place] woman Status post left breast lower inner quadrant biopsy 03/09/2016 for ductal carcinoma in situ, grade 1, estrogen and progesterone receptor positive  (1) status post left lumpectomy 04/15/2016 for a pT1b pNX, stage IA invasive ductal carcinoma measuring 0.7 cm, grade 2, with negative margins.  (2) adjuvant radiation completed 06/08/2016  (3) started anastrozole 07/28/2016  (4) osteopenia, with a T score of -1.7 on bone density scan January 2017.  PLAN: Danniel is now just over 3 years out from definitive surgery for breast cancer with no evidence of disease recurrence.  This is very favorable.  She is tolerating tamoxifen well and obtaining an adequate price.  The plan will be to continue that for total of 5 weeks.  She will  return to see me a year from now.  She knows to call for any issues that may develop before that visit.  Magrinat, Virgie Dad, MD  05/23/19 3:10 PM Medical Oncology and Hematology [facility] [address] [place], [place] [address] Tel. [phone]    Fax. [phone]  I, Wilburn Mylar, am acting as scribe for Dr. Sarajane Jews C. Magrinat.  I, Lurline Del MD, have reviewed the above documentation for accuracy and completeness, and I agree with the above.

## 2020-01-22 ENCOUNTER — Ambulatory Visit: Payer: Medicare Other | Attending: Internal Medicine

## 2020-01-22 DIAGNOSIS — Z23 Encounter for immunization: Secondary | ICD-10-CM | POA: Insufficient documentation

## 2020-01-22 NOTE — Progress Notes (Signed)
   Covid-19 Vaccination Clinic  Name:  Cindy Whitaker    MRN: [id number] DOB: 1941/01/30  01/22/2020  Ms. Cindy Whitaker was observed post Covid-19 immunization for 15 minutes without incidence. She was provided with Vaccine Information Sheet and instruction to access the V-Safe system.   Ms. Cindy Whitaker was instructed to call 911 with any severe reactions post vaccine: Marland Kitchen Difficulty breathing  . Swelling of your face and throat  . A fast heartbeat  . A bad rash all over your body  . Dizziness and weakness    Immunizations Administered    Name Date Dose VIS Date Route   Pfizer COVID-19 Vaccine 01/22/2020 10:03 AM 0.3 mL 12/08/2019 Intramuscular   Manufacturer: [place]   Lot: [id number]   [place]: [id number]

## 2020-02-12 ENCOUNTER — Ambulatory Visit: Payer: Medicare Other | Attending: Internal Medicine

## 2020-02-12 DIAGNOSIS — Z23 Encounter for immunization: Secondary | ICD-10-CM | POA: Insufficient documentation

## 2020-02-12 NOTE — Progress Notes (Signed)
   Covid-19 Vaccination Clinic  Name:  Cindy Whitaker    MRN: [id number] DOB: 1941/05/28  02/12/2020  [name] was observed post Covid-19 immunization for 15 minutes without incidence. She was provided with Vaccine Information Sheet and instruction to access the V-Safe system.   [name] was instructed to call 911 with any severe reactions post vaccine: Marland Kitchen Difficulty breathing  . Swelling of your face and throat  . A fast heartbeat  . A bad rash all over your body  . Dizziness and weakness    Immunizations Administered    Name Date Dose VIS Date Route   Pfizer COVID-19 Vaccine 02/12/2020 10:21 AM 0.3 mL 12/08/2019 Intramuscular   Manufacturer: [place]   Lot: [id number]   [place]: [id number]

## 2020-04-03 ENCOUNTER — Encounter: Payer: Self-pay | Admitting: Oncology

## 2020-05-21 ENCOUNTER — Other Ambulatory Visit: Payer: Self-pay | Admitting: *Deleted

## 2020-05-21 DIAGNOSIS — C50312 Malignant neoplasm of lower-inner quadrant of left female breast: Secondary | ICD-10-CM

## 2020-05-21 NOTE — Progress Notes (Signed)
[place]  [phone] [phone]     ID: Cindy Whitaker DOB: 12/15/1941  MR#: [id number]  [id number]  Patient Care Team: Cindy Pao, MD as PCP - General (Internal Medicine) Cindy Whitaker, Cindy Dad, MD as Consulting Physician (Oncology) Cindy Luna, MD as Consulting Physician (General Surgery) Cindy Gibson, MD as Attending Physician (Radiation Oncology) Cindy Craver, MD as Consulting Physician (Gastroenterology) Cindy Whitaker, DMD (Dentistry) Cindy Whitaker, OD (Optometry) OTHER MD:   CHIEF COMPLAINT: Invasive ductal carcinoma, estrogen receptor positive  CURRENT TREATMENT:  anastrozole   INTERVAL HISTORY: Cindy Whitaker returns today for a follow-up of her invasive ductal carcinoma, estrogen receptor positive.   The patient continues on anastrozole, which she tolerates well. She reports some hot flashes and denies any other symptoms. She does not have to pay for it.   Since her last visit, she underwent bilateral diagnostic mammography with tomography at [facility] on 04/03/2020 showing: breast density category B; no evidence of malignancy in either breast.  Specifically the regional calcifications in the left breast were benign.  She has received both doses of the Covid-19 vaccine-- on 1/25 and 02/12/2020.   REVIEW OF SYSTEMS: [name] has had both her Pfizer vaccine doses and tolerated them with minimal side effects.  Her husband also has vaccinated.  Is just the 2 of them at home.  She has significant pain in all 10 of her fingers.  She saw Dr. Osborne Casco today and he is doing some lab work to evaluate that.  She exercises on a regular basis by walking.  A detailed review of systems today was otherwise noncontributory   BREAST CANCER HISTORY: From the original intake note:  The patient had routine screening mammography at [facility] 03/02/2016 with tomosynthesis. This showed the breast density to be category be. In the left breast lower inner  quadrant there was a new oval mass and ultrasound performed the next day confirmed a 9 mm lobulated mass which was hypoechoic and showed increased vascularity. The left axilla was sonographically benign.  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  Her subsequent history is as detailed below   PAST MEDICAL HISTORY: Past Medical History:  Diagnosis Date  . Breast cancer ([place])   . Breast cancer of lower-inner quadrant of left female breast ([place]) 03/13/2016  . Glaucoma   . Hx of radiation therapy 05/22/16- 06/12/16   Left Breast  . Hyperlipidemia   . Hypertension   . Osteoporosis     PAST SURGICAL HISTORY: Past Surgical History:  Procedure Laterality Date  . BREAST LUMPECTOMY WITH RADIOACTIVE SEED LOCALIZATION Left 04/15/2016   Procedure: BREAST LUMPECTOMY WITH RADIOACTIVE SEED LOCALIZATION;  Surgeon: Cindy Luna, MD;  Location: [place];  Service: General;  Laterality: Left;  . FOOT SURGERY Right     FAMILY HISTORY Family History  Problem Relation Age of Onset  . Tuberculosis Mother    The patient's father died at the age of 5, from "old age" the patient's mother died from tuberculosis at the age of 86. The patient has 3 brothers, 5 sisters. There is no history of breast or ovarian cancer in the family to her knowledge   GYNECOLOGIC HISTORY:  No LMP recorded. Patient is postmenopausal. Menarche age 89, first live birth age 70. The patient is GX P3.  She stopped having periods in April 1995. She did not take hormone replacement. She never used oral contraceptives.   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 She is originally from [place].  Her husband [name] is a retired Education officer, museum. Their  daughter [name] lives in [place] and is a Programmer, systems; daughter Kamecia Debock Lives in [place] where she works as a Research scientist (physical sciences). Son Brianni Gonzalo this in [place] where he works as a Government social research officer. The patient has 3 grandchildren. She attends a local [employer]: Not in place   HEALTH MAINTENANCE: Social History   Tobacco Use  . Smoking status: Never Smoker  Substance Use Topics  . Alcohol use: No  . Drug use: No     Colonoscopy:09/17/2014/[facility]  PAP: 2014  Bone density: 01/28/2016, lowest T score -1.7   Lipid panel:  No Known Allergies  Current Outpatient Medications  Medication Sig Dispense Refill  . amLODipine (NORVASC) 5 MG tablet TK 1 T PO QD  5  . anastrozole (ARIMIDEX) 1 MG tablet Take 1 tablet (1 mg total) by mouth daily. 90 tablet 4  . Ascorbic Acid (VITAMIN C) 100 MG tablet Take 600 mg by mouth daily.    . Calcium Carbonate (CALCIUM 600 HIGH POTENCY PO) Take by mouth.    . Cholecalciferol (VITAMIN D3) 5000 units CAPS Take by mouth.    . ezetimibe (ZETIA) 10 MG tablet Take 1 tablet (10 mg total) by mouth daily.    . Flaxseed, Linseed, (FLAXSEED OIL) 1000 MG CAPS Take by mouth.    Marland Kitchen ibuprofen (ADVIL,MOTRIN) 200 MG tablet Take 200 mg by mouth every 6 (six) hours as needed.    . latanoprost (XALATAN) 0.005 % ophthalmic solution Place 1 drop into both eyes at bedtime.    Marland Kitchen losartan (COZAAR) 50 MG tablet Take 50 mg by mouth daily.    . Omega-3 Fatty Acids (FISH OIL) 1000 MG CPDR Take by mouth.     No current facility-administered medications for this visit.    OBJECTIVE: African woman who appears stated age 79:   05/22/20 1428  BP: (!) 153/64  Pulse: 74  Resp: 18  Temp: 98.6 F (37 C)  SpO2: 100%     Body mass index is 31.31 kg/m.    ECOG FS:1 - Symptomatic but completely ambulatory  Sclerae unicteric, EOMs intact Wearing a mask No cervical or supraclavicular adenopathy Lungs no  rales or rhonchi Heart regular rate and rhythm Abd soft, nontender, positive bowel sounds MSK no focal spinal tenderness, no upper extremity lymphedema Neuro: nonfocal, well oriented, appropriate affect Breasts: The right breast is unremarkable.  The left breast is status post lumpectomy and radiation.  There is no evidence of local recurrence.  Both axillae are benign.   LAB RESULTS:  CMP     Component Value Date/Time   NA 140 05/20/2018 1252   NA 141 05/05/2017 0933   K 4.6 05/20/2018 1252   K 4.4 05/05/2017 0933   CL 105 05/20/2018 1252   CO2 25 05/20/2018 1252   CO2 27 05/05/2017 0933   GLUCOSE 82 05/20/2018 1252   GLUCOSE 113 05/05/2017 0933   BUN 17 05/20/2018 1252   BUN 14.7 05/05/2017 0933   CREATININE 0.91 05/20/2018 1252   CREATININE 1.0 05/05/2017 0933   CALCIUM 9.6 05/20/2018 1252   CALCIUM 9.7 05/05/2017 0933   PROT 8.0 05/20/2018 1252   PROT 7.9  05/05/2017 0933   ALBUMIN 4.0 05/20/2018 1252   ALBUMIN 3.9 05/05/2017 0933   AST 19 05/20/2018 1252   AST 18 05/05/2017 0933   ALT 18 05/20/2018 1252   ALT 19 05/05/2017 0933   ALKPHOS 69 05/20/2018 1252   ALKPHOS 71 05/05/2017 0933   BILITOT 0.6 05/20/2018 1252   BILITOT 0.62 05/05/2017 0933   GFRNONAA >60 05/20/2018 1252   GFRAA >60 05/20/2018 1252    INo results found for: SPEP, UPEP  Lab Results  Component Value Date   WBC 9.5 05/22/2020   NEUTROABS 4.9 05/22/2020   HGB 12.1 05/22/2020   HCT 34.8 (L) 05/22/2020   MCV 87.7 05/22/2020   PLT 267 05/22/2020      Chemistry      Component Value Date/Time   NA 140 05/20/2018 1252   NA 141 05/05/2017 0933   K 4.6 05/20/2018 1252   K 4.4 05/05/2017 0933   CL 105 05/20/2018 1252   CO2 25 05/20/2018 1252   CO2 27 05/05/2017 0933   BUN 17 05/20/2018 1252   BUN 14.7 05/05/2017 0933   CREATININE 0.91 05/20/2018 1252   CREATININE 1.0 05/05/2017 0933      Component Value Date/Time   CALCIUM 9.6 05/20/2018 1252   CALCIUM 9.7 05/05/2017 0933   ALKPHOS  69 05/20/2018 1252   ALKPHOS 71 05/05/2017 0933   AST 19 05/20/2018 1252   AST 18 05/05/2017 0933   ALT 18 05/20/2018 1252   ALT 19 05/05/2017 0933   BILITOT 0.6 05/20/2018 1252   BILITOT 0.62 05/05/2017 0933       No results found for: LABCA2  No components found for: LABCA125  No results for input(s): INR in the last 168 hours.  Urinalysis    Component Value Date/Time   COLORURINE YELLOW 04/04/2010 0228   APPEARANCEUR CLEAR 04/04/2010 0228   LABSPEC 1.021 04/04/2010 0228   PHURINE 8.5 (H) 04/04/2010 0228   GLUCOSEU NEGATIVE 04/04/2010 0228   HGBUR NEGATIVE 04/04/2010 0228   BILIRUBINUR NEGATIVE 04/04/2010 0228   KETONESUR NEGATIVE 04/04/2010 0228   PROTEINUR 30 (A) 04/04/2010 0228   UROBILINOGEN 0.2 04/04/2010 0228   NITRITE NEGATIVE 04/04/2010 0228   LEUKOCYTESUR SMALL (A) 04/04/2010 0228    ELIGIBLE FOR AVAILABLE RESEARCH PROTOCOL: No   STUDIES: No results found.   ASSESSMENT: 79 y.o. [place] woman Status post left breast lower inner quadrant biopsy 03/09/2016 for ductal carcinoma in situ, grade 1, estrogen and progesterone receptor positive  (1) status post left lumpectomy 04/15/2016 for a pT1b pNX, stage IA invasive ductal carcinoma measuring 0.7 cm, grade 2, with negative margins.  (2) adjuvant radiation completed 06/08/2016  (3) started anastrozole 07/28/2016  (4) osteopenia, with a T score of -1.7 on bone density scan January 2017.   PLAN: Doreather is now a little over 4 years out from definitive surgery for her breast cancer with no evidence of disease recurrence.  This is very favorable.  She is tolerating anastrozole well it is possible this is contributing to the discomfort in her hands/fingers but more likely this is due to arthritis.  Of course carpal tunnel is another possibility.  I suggested she try a wrist splint on the right wrist for 2 or 3 weeks and see if the right hand starts feeling better than the left in which case she has her  diagnosis and treatment together  She will see me 1 last time June of next year.  At that point she will be ready to "graduate".  Total encounter time 25 minutes.*  Marquay Kruse, Cindy Dad, MD  05/22/20 2:43 PM Medical Oncology and Hematology [facility] [place], [place] [address] Tel. [phone]    Fax. [phone]   I, Wilburn Mylar, am acting as scribe for Dr. Virgie Whitaker. Wilber Fini.  I, Lurline Del MD, have reviewed the above documentation for accuracy and completeness, and I agree with the above.   *Total Encounter Time as defined by the Centers for Medicare and Medicaid Services includes, in addition to the face-to-face time of a patient visit (documented in the note above) non-face-to-face time: obtaining and reviewing outside history, ordering and reviewing medications, tests or procedures, care coordination (communications with other health care professionals or caregivers) and documentation in the medical record.

## 2020-05-22 ENCOUNTER — Inpatient Hospital Stay: Payer: Medicare Other | Admitting: Oncology

## 2020-05-22 ENCOUNTER — Other Ambulatory Visit: Payer: Self-pay

## 2020-05-22 ENCOUNTER — Inpatient Hospital Stay: Payer: Medicare Other | Attending: Oncology

## 2020-05-22 VITALS — BP 153/64 | HR 74 | Temp 98.6°F | Resp 18 | Ht 62.0 in | Wt 171.2 lb

## 2020-05-22 DIAGNOSIS — Z923 Personal history of irradiation: Secondary | ICD-10-CM | POA: Diagnosis not present

## 2020-05-22 DIAGNOSIS — C50312 Malignant neoplasm of lower-inner quadrant of left female breast: Secondary | ICD-10-CM | POA: Insufficient documentation

## 2020-05-22 DIAGNOSIS — Z170 Estrogen receptor positive status [ER+]: Secondary | ICD-10-CM

## 2020-05-22 DIAGNOSIS — M8580 Other specified disorders of bone density and structure, unspecified site: Secondary | ICD-10-CM | POA: Diagnosis not present

## 2020-05-22 LAB — CBC WITH DIFFERENTIAL (CANCER CENTER ONLY)
Abs Immature Granulocytes: 0.02 10*3/uL (ref 0.00–0.07)
Basophils Absolute: 0.1 10*3/uL (ref 0.0–0.1)
Basophils Relative: 1 %
Eosinophils Absolute: 0.2 10*3/uL (ref 0.0–0.5)
Eosinophils Relative: 2 %
HCT: 34.8 % — ABNORMAL LOW (ref 36.0–46.0)
Hemoglobin: 12.1 g/dL (ref 12.0–15.0)
Immature Granulocytes: 0 %
Lymphocytes Relative: 38 %
Lymphs Abs: 3.6 10*3/uL (ref 0.7–4.0)
MCH: 30.5 pg (ref 26.0–34.0)
MCHC: 34.8 g/dL (ref 30.0–36.0)
MCV: 87.7 fL (ref 80.0–100.0)
Monocytes Absolute: 0.7 10*3/uL (ref 0.1–1.0)
Monocytes Relative: 8 %
Neutro Abs: 4.9 10*3/uL (ref 1.7–7.7)
Neutrophils Relative %: 51 %
Platelet Count: 267 10*3/uL (ref 150–400)
RBC: 3.97 MIL/uL (ref 3.87–5.11)
RDW: 12.3 % (ref 11.5–15.5)
WBC Count: 9.5 10*3/uL (ref 4.0–10.5)
nRBC: 0 % (ref 0.0–0.2)

## 2020-05-22 LAB — CMP (CANCER CENTER ONLY)
ALT: 12 U/L (ref 0–44)
AST: 16 U/L (ref 15–41)
Albumin: 3.8 g/dL (ref 3.5–5.0)
Alkaline Phosphatase: 70 U/L (ref 38–126)
Anion gap: 8 (ref 5–15)
BUN: 15 mg/dL (ref 8–23)
CO2: 25 mmol/L (ref 22–32)
Calcium: 9.5 mg/dL (ref 8.9–10.3)
Chloride: 106 mmol/L (ref 98–111)
Creatinine: 1.19 mg/dL — ABNORMAL HIGH (ref 0.44–1.00)
GFR, Est AFR Am: 51 mL/min — ABNORMAL LOW (ref >60)
GFR, Estimated: 44 mL/min — ABNORMAL LOW (ref >60)
Glucose, Bld: 123 mg/dL — ABNORMAL HIGH (ref 70–99)
Potassium: 3.8 mmol/L (ref 3.5–5.1)
Sodium: 139 mmol/L (ref 135–145)
Total Bilirubin: 0.4 mg/dL (ref 0.3–1.2)
Total Protein: 7.9 g/dL (ref 6.5–8.1)

## 2020-05-22 MED ORDER — ANASTROZOLE 1 MG PO TABS: 1.0000 mg | ORAL_TABLET | Freq: Every day | ORAL | 4 refills | Status: DC

## 2020-05-23 ENCOUNTER — Telehealth: Payer: Self-pay | Admitting: Oncology

## 2020-05-23 NOTE — Telephone Encounter (Signed)
Scheduled appt per 5/26 los. Pt confirmed appt date and time. Updated patient's phone number while one the phone with her. Pt also had a question regarding her perscription, and was transferred to [facility] nurse.

## 2021-04-20 ENCOUNTER — Other Ambulatory Visit: Payer: Self-pay | Admitting: Oncology

## 2021-04-23 ENCOUNTER — Telehealth: Payer: Self-pay | Admitting: Oncology

## 2021-04-23 NOTE — Telephone Encounter (Signed)
R/s appt per 4/24 sch msg. Pt aware.  

## 2021-05-04 ENCOUNTER — Other Ambulatory Visit: Payer: Self-pay | Admitting: Oncology

## 2021-05-05 ENCOUNTER — Telehealth: Payer: Self-pay | Admitting: Oncology

## 2021-05-05 NOTE — Telephone Encounter (Signed)
Rescheduled appointment per 05/08 schedule message. Patient is aware. 

## 2021-05-15 ENCOUNTER — Other Ambulatory Visit: Payer: Self-pay | Admitting: Oncology

## 2021-05-31 ENCOUNTER — Other Ambulatory Visit: Payer: Self-pay

## 2021-05-31 ENCOUNTER — Ambulatory Visit (HOSPITAL_COMMUNITY)
Admission: EM | Admit: 2021-05-31 | Discharge: 2021-05-31 | Disposition: A | Discharge status: Home / Self Care | Payer: Medicare Other | Attending: Physician Assistant | Admitting: Physician Assistant

## 2021-05-31 ENCOUNTER — Encounter (HOSPITAL_COMMUNITY): Payer: Self-pay | Admitting: *Deleted

## 2021-05-31 DIAGNOSIS — Z79899 Other long term (current) drug therapy: Secondary | ICD-10-CM | POA: Diagnosis not present

## 2021-05-31 DIAGNOSIS — R030 Elevated blood-pressure reading, without diagnosis of hypertension: Secondary | ICD-10-CM | POA: Diagnosis not present

## 2021-05-31 DIAGNOSIS — U071 COVID-19: Secondary | ICD-10-CM | POA: Diagnosis present

## 2021-05-31 DIAGNOSIS — R509 Fever, unspecified: Secondary | ICD-10-CM | POA: Insufficient documentation

## 2021-05-31 DIAGNOSIS — R059 Cough, unspecified: Secondary | ICD-10-CM | POA: Diagnosis not present

## 2021-05-31 DIAGNOSIS — I10 Essential (primary) hypertension: Secondary | ICD-10-CM | POA: Diagnosis not present

## 2021-05-31 DIAGNOSIS — J069 Acute upper respiratory infection, unspecified: Secondary | ICD-10-CM

## 2021-05-31 DIAGNOSIS — R5381 Other malaise: Secondary | ICD-10-CM | POA: Diagnosis not present

## 2021-05-31 DIAGNOSIS — R52 Pain, unspecified: Secondary | ICD-10-CM

## 2021-05-31 LAB — POC INFLUENZA A AND B ANTIGEN (URGENT CARE ONLY)
INFLUENZA A ANTIGEN, POC: NEGATIVE
INFLUENZA B ANTIGEN, POC: NEGATIVE

## 2021-05-31 LAB — SARS CORONAVIRUS 2 (TAT 6-24 HRS): SARS Coronavirus 2: POSITIVE — AB

## 2021-05-31 MED ORDER — BENZONATATE 100 MG PO CAPS: 100.0000 mg | ORAL_CAPSULE | Freq: Three times a day (TID) | ORAL | 0 refills | Status: AC

## 2021-05-31 NOTE — ED Provider Notes (Signed)
[place]    CSN: [id number] Arrival date & time: 05/31/21  1007      History   Chief Complaint Chief Complaint  Patient presents with  . Fatigue  . Cough    HPI Cindy Whitaker is a 80 y.o. female.   Patient presents today with a 2 to 3-day history of cough.  She reports associated body aches, subjective fever, headache, fatigue, malaise.  Denies any chest pain, shortness of breath, nausea, vomiting, dizziness, syncope.  She has tried Vicks VapoRub and Tylenol without improvement of symptoms.  Reports husband has similar symptoms.  Denies additional known sick contacts.  She is up-to-date on influenza and COVID-19 vaccinations including booster.  She denies recent antibiotic use.  Denies history of allergies, asthma, COPD, smoking.  She is having difficulty with daily activities as a result of symptoms.  In addition, patient was noted to have elevated blood pressure on intake.  She reports taking blood pressure medication just prior to arriving at clinic.  She has not been monitoring her blood pressure at home but does have blood pressure monitoring device.  She denies any decongestant use or increased sodium intake.  She denies any chest pain, shortness of breath, visual disturbance, leg swelling, severe headache.     Past Medical History:  Diagnosis Date  . Breast cancer ([place])   . Breast cancer of lower-inner quadrant of left female breast ([place]) 03/13/2016  . Glaucoma   . Hx of radiation therapy 05/22/16- 06/12/16   Left Breast  . Hyperlipidemia   . Hypertension   . Osteoporosis     Patient Active Problem List   Diagnosis Date Noted  . Malignant neoplasm of lower-inner quadrant of left breast in female, estrogen receptor positive ([place]) 03/13/2016    Past Surgical History:  Procedure Laterality Date  . BREAST LUMPECTOMY WITH RADIOACTIVE SEED LOCALIZATION Left 04/15/2016   Procedure: BREAST LUMPECTOMY WITH RADIOACTIVE SEED LOCALIZATION;  Surgeon: Erroll Luna,  MD;  Location: [facility];  Service: General;  Laterality: Left;  . FOOT SURGERY Right     OB History   No obstetric history on file.      Home Medications    Prior to Admission medications   Medication Sig Start Date End Date Taking? Authorizing Provider  benzonatate (TESSALON) 100 MG capsule Take 1 capsule (100 mg total) by mouth every 8 (eight) hours. 05/31/21  Yes Lara Palinkas K, PA-C  amLODipine (NORVASC) 5 MG tablet TK 1 T PO QD 01/28/16   [provider]  anastrozole (ARIMIDEX) 1 MG tablet TAKE 1 TABLET BY MOUTH  DAILY 05/15/21   Magrinat, Virgie Dad, MD  Ascorbic Acid (VITAMIN C) 100 MG tablet Take 600 mg by mouth daily.    [provider]  Calcium Carbonate (CALCIUM 600 HIGH POTENCY PO) Take by mouth.    [provider]  Cholecalciferol (VITAMIN D3) 5000 units CAPS Take by mouth.    [provider]  ezetimibe (ZETIA) 10 MG tablet Take 1 tablet (10 mg total) by mouth daily. 05/20/18   Magrinat, Virgie Dad, MD  Flaxseed, Linseed, (FLAXSEED OIL) 1000 MG CAPS Take by mouth.    [provider]  ibuprofen (ADVIL,MOTRIN) 200 MG tablet Take 200 mg by mouth every 6 (six) hours as needed.    [provider]  latanoprost (XALATAN) 0.005 % ophthalmic solution Place 1 drop into both eyes at bedtime.    [provider]  losartan (COZAAR) 50 MG tablet Take 50 mg by mouth  daily.    [provider]  Omega-3 Fatty Acids (FISH OIL) 1000 MG CPDR Take by mouth.    [provider]    Family History Family History  Problem Relation Age of Onset  . Tuberculosis Mother     Social History Social History   Tobacco Use  . Smoking status: Never Smoker  . Smokeless tobacco: Never Used  Substance Use Topics  . Alcohol use: No  . Drug use: No     Allergies   Patient has no known allergies.   Review of Systems Review of Systems  Constitutional: Positive for activity change, fatigue and fever  (subjective). Negative for appetite change.  HENT: Positive for congestion and sinus pressure. Negative for sneezing and sore throat.   Eyes: Negative for visual disturbance.  Respiratory: Positive for cough. Negative for shortness of breath.   Cardiovascular: Negative for chest pain, palpitations and leg swelling.  Gastrointestinal: Negative for abdominal pain, diarrhea, nausea and vomiting.  Musculoskeletal: Positive for arthralgias and myalgias.  Neurological: Positive for headaches. Negative for dizziness and light-headedness.     Physical Exam Triage Vital Signs ED Triage Vitals  Enc Vitals Group     BP 05/31/21 1029 (!) 175/80     Pulse Rate 05/31/21 1029 72     Resp 05/31/21 1029 20     Temp 05/31/21 1029 98 F (36.7 C)     Temp Source 05/31/21 1029 Oral     SpO2 05/31/21 1029 98 %     Weight --      Height --      Head Circumference --      Peak Flow --      Pain Score 05/31/21 1030 6     Pain Loc --      Pain Edu? --      Excl. in [place]? --    No data found.  Updated Vital Signs BP (!) 183/81   Pulse 72   Temp 98 F (36.7 C) (Oral)   Resp 20   SpO2 98%   Visual Acuity Right Eye Distance:   Left Eye Distance:   Bilateral Distance:    Right Eye Near:   Left Eye Near:    Bilateral Near:     Physical Exam Vitals reviewed.  Constitutional:      General: She is awake. She is not in acute distress.    Appearance: Normal appearance. She is well-groomed and normal weight. She is not ill-appearing.     Comments: Very pleasant female appears stated age in no acute distress  HENT:     Head: Normocephalic and atraumatic.     Right Ear: Tympanic membrane, ear canal and external ear normal. Tympanic membrane is not erythematous or bulging.     Left Ear: Tympanic membrane, ear canal and external ear normal. Tympanic membrane is not erythematous or bulging.     Nose:     Right Sinus: No maxillary sinus tenderness or frontal sinus tenderness.     Left Sinus: No  maxillary sinus tenderness or frontal sinus tenderness.     Mouth/Throat:     Pharynx: Uvula midline. No oropharyngeal exudate or posterior oropharyngeal erythema.     Comments: Drainage in posterior pharynx Cardiovascular:     Rate and Rhythm: Normal rate and regular rhythm.     Heart sounds: Normal heart sounds. No murmur heard.   Pulmonary:     Effort: Pulmonary effort is normal.     Breath sounds: Normal breath sounds. No  wheezing, rhonchi or rales.     Comments: Clear to auscultation bilaterally Musculoskeletal:     Right lower leg: No edema.     Left lower leg: No edema.  Lymphadenopathy:     Head:     Right side of head: No submental, submandibular or tonsillar adenopathy.     Left side of head: No submental, submandibular or tonsillar adenopathy.     Cervical: No cervical adenopathy.  Psychiatric:        Behavior: Behavior is cooperative.      UC Treatments / Results  Labs (all labs ordered are listed, but only abnormal results are displayed) Labs Reviewed  SARS CORONAVIRUS 2 (TAT 6-24 HRS)  POC INFLUENZA A AND B ANTIGEN (URGENT CARE ONLY)    EKG   Radiology No results found.  Procedures Procedures (including critical care time)  Medications Ordered in UC Medications - No data to display  Initial Impression / Assessment and Plan / UC Course  I have reviewed the triage vital signs and the nursing notes.  Pertinent labs & imaging results that were available during my care of the patient were reviewed by me and considered in my medical decision making (see chart for details).     Suspect viral etiology given short duration of symptoms.  Flu testing was negative in office today.  COVID-19 testing is pending.  Patient was prescribed Tessalon to be used as needed for cough.  Recommend she use over-the-counter medications including Mucinex and Flonase for additional symptom relief.  Discussed alarm symptoms that warrant reevaluation.  Strict return precautions  given to which patient expressed understanding.  Blood pressure remained elevated during visit today.  Patient denies any current symptoms of endorgan damage.  She was instructed to monitor this at home and return to our clinic or see PCP if this remains above 140/90.  Recommended she avoid caffeine and decongestants.  Discussed alarm symptoms or warrant emergent evaluation.  If blood pressure improves she does not have any alarm symptoms she is to follow-up with PCP within 1 week for recheck.  Final Clinical Impressions(s) / UC Diagnoses   Final diagnoses:  Viral URI with cough  Cough  Subjective fever  Body aches  Elevated blood pressure reading with diagnosis of hypertension     Discharge Instructions     Your blood pressure was elevated in clinic today.  Please monitor this at home and if this remains above 140/90 please return to our clinic or see your PCP for medication adjustment.  If you develop a severe headache, chest pain, shortness of breath, vision changes with elevated blood pressure you need to go to the emergency room.  Your flu test was negative.  I suspect this is a virus.  We will contact you for COVID-19 testing is positive.  Please use Tessalon to help manage cough.  Alternate Tylenol and ibuprofen for fever and pain.  Use Mucinex and Tylenol for additional symptom relief but avoid anything with a decongestant given your elevated blood pressure.  If you have any worsening symptoms please return for reevaluation.    ED Prescriptions    Medication Sig Dispense Auth. Provider   benzonatate (TESSALON) 100 MG capsule Take 1 capsule (100 mg total) by mouth every 8 (eight) hours. 21 capsule Nickolaus Bordelon K, PA-C     PDMP not reviewed this encounter.   Terrilee Croak, PA-C 05/31/21 1245

## 2021-05-31 NOTE — ED Notes (Signed)
Reported  To Erin  Pt BP was elevated.

## 2021-05-31 NOTE — Discharge Instructions (Addendum)
Your blood pressure was elevated in clinic today.  Please monitor this at home and if this remains above 140/90 please return to our clinic or see your PCP for medication adjustment.  If you develop a severe headache, chest pain, shortness of breath, vision changes with elevated blood pressure you need to go to the emergency room.  Your flu test was negative.  I suspect this is a virus.  We will contact you for COVID-19 testing is positive.  Please use Tessalon to help manage cough.  Alternate Tylenol and ibuprofen for fever and pain.  Use Mucinex and Tylenol for additional symptom relief but avoid anything with a decongestant given your elevated blood pressure.  If you have any worsening symptoms please return for reevaluation.

## 2021-05-31 NOTE — ED Triage Notes (Signed)
Pt reports Sx's started 3 days ago. Pt reports feeling so fatigued yesterday.

## 2021-06-02 ENCOUNTER — Telehealth (HOSPITAL_COMMUNITY): Payer: Self-pay | Admitting: Emergency Medicine

## 2021-06-02 ENCOUNTER — Ambulatory Visit (HOSPITAL_COMMUNITY)
Admission: EM | Admit: 2021-06-02 | Discharge: 2021-06-02 | Disposition: A | Discharge status: Home / Self Care | Payer: Medicare Other | Attending: Family Medicine | Admitting: Family Medicine

## 2021-06-02 DIAGNOSIS — Z01812 Encounter for preprocedural laboratory examination: Secondary | ICD-10-CM | POA: Diagnosis not present

## 2021-06-02 DIAGNOSIS — Z7689 Persons encountering health services in other specified circumstances: Secondary | ICD-10-CM | POA: Diagnosis present

## 2021-06-02 LAB — BASIC METABOLIC PANEL
Anion gap: 9 (ref 5–15)
BUN: 18 mg/dL (ref 8–23)
CO2: 27 mmol/L (ref 22–32)
Calcium: 9.2 mg/dL (ref 8.9–10.3)
Chloride: 99 mmol/L (ref 98–111)
Creatinine, Ser: 1.08 mg/dL — ABNORMAL HIGH (ref 0.44–1.00)
GFR, Estimated: 53 mL/min — ABNORMAL LOW (ref >60)
Glucose, Bld: 90 mg/dL (ref 70–99)
Potassium: 4.1 mmol/L (ref 3.5–5.1)
Sodium: 135 mmol/L (ref 135–145)

## 2021-06-02 NOTE — Telephone Encounter (Signed)
Opened in error

## 2021-06-02 NOTE — ED Triage Notes (Signed)
Pt is present today for a CMP labs.

## 2021-06-10 ENCOUNTER — Other Ambulatory Visit: Payer: Medicare Other

## 2021-06-10 ENCOUNTER — Ambulatory Visit: Payer: Medicare Other | Admitting: Oncology

## 2021-07-09 NOTE — Progress Notes (Signed)
[place]  [phone] Fax:(336) [phone]     ID: Cindy Whitaker DOB: 08/02/1941  MR#: [id number]  LZJ#:[id number]  Patient Care Team: Haywood Pao, MD as PCP - General (Internal Medicine) Hannah Crill, Virgie Dad, MD as Consulting Physician (Oncology) Erroll Luna, MD as Consulting Physician (General Surgery) Eppie Gibson, MD as Attending Physician (Radiation Oncology) Juanita Craver, MD as Consulting Physician (Gastroenterology) Iven Finn, DMD (Dentistry) Tora Perches Shyrl Numbers, OD (Optometry) OTHER MD:   CHIEF COMPLAINT: Invasive ductal carcinoma, estrogen receptor positive  CURRENT TREATMENT:  anastrozole   INTERVAL HISTORY: Cindy Whitaker returns today for a follow-up of her invasive ductal carcinoma, estrogen receptor positive.   She has done very well on anastrozole, with no significant side effects that she is aware of.  She has about 40 pills left and that is just enough to get her through August.  She can stop at that point.  Since her last visit, she underwent bilateral diagnostic mammography with tomography at [facility] on 04/07/2021 showing: breast density category B; no evidence of malignancy in either breast.   She has been scheduled for repeat mammography at [facility] 04/13/2022   REVIEW OF SYSTEMS: Cindy Whitaker tells me her granddaughter is getting married in [place] and she is very excited about that.  A detailed review of systems today was otherwise benign   COVID 19 VACCINATION STATUS: [place] x2, status post booster x1, also had COVID June 2022   BREAST CANCER HISTORY: From the original intake note:  The patient had routine screening mammography at [facility] 03/02/2016 with tomosynthesis. This showed the breast density to be category be. In the left breast lower inner quadrant there was a new oval mass and ultrasound performed the next day confirmed a 9 mm lobulated mass which was hypoechoic and showed increased vascularity. The left axilla was  sonographically benign.  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  Her subsequent history is as detailed below   PAST MEDICAL HISTORY: Past Medical History:  Diagnosis Date   Breast cancer ([place])    Breast cancer of lower-inner quadrant of left female breast ([place]) 03/13/2016   Glaucoma    Hx of radiation therapy 05/22/16- 06/12/16   Left Breast   Hyperlipidemia    Hypertension    Osteoporosis     PAST SURGICAL HISTORY: Past Surgical History:  Procedure Laterality Date   BREAST LUMPECTOMY WITH RADIOACTIVE SEED LOCALIZATION Left 04/15/2016   Procedure: BREAST LUMPECTOMY WITH RADIOACTIVE SEED LOCALIZATION;  Surgeon: Erroll Luna, MD;  Location: [place];  Service: General;  Laterality: Left;   FOOT SURGERY Right     FAMILY HISTORY Family History  Problem Relation Age of Onset   Tuberculosis Mother   The patient's father died at the age of 60, from "old age" the patient's mother died from tuberculosis at the age of 72. The patient has 3 brothers, 5 sisters. There is no history of breast or ovarian cancer in the family to her knowledge   GYNECOLOGIC HISTORY:  No LMP recorded. Patient is postmenopausal. Menarche age 88, first live birth age 59. The patient is GX P3. She stopped having periods in April 1995. She did not take hormone replacement. She never used oral contraceptives.   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  She is originally from [place].  Her husband [name] is a retired Education officer, museum. Their daughter Cindy Whitaker lives in [place] and is a Programmer, systems; daughter Cindy Whitaker Lives in [place] where she works as a Research scientist (physical sciences). Son Cindy Whitaker this in [place] where he works as a Government social research officer. The patient has 3 grandchildren. She attends a local [place]: Not in place   HEALTH MAINTENANCE: Social History   Tobacco Use   Smoking status: Never   Smokeless tobacco: Never  Substance Use Topics   Alcohol use: No   Drug use: No     Colonoscopy:09/17/2014/[facility]  PAP: 2014  Bone density: 01/28/2016, lowest T score -1.7   Lipid panel:  No Known Allergies  Current Outpatient Medications  Medication Sig Dispense Refill   Ascorbic Acid (VITAMIN C) 100 MG tablet Take 600 mg by mouth daily.     benzonatate (TESSALON) 100 MG capsule Take 1 capsule (100 mg total) by mouth every 8 (eight) hours. 21 capsule 0   Calcium Carbonate (CALCIUM 600 HIGH POTENCY PO) Take by mouth.     Cholecalciferol (VITAMIN D3) 5000 units CAPS Take by mouth.     ezetimibe (ZETIA) 10 MG tablet Take 1 tablet (10 mg total) by mouth daily.     Flaxseed, Linseed, (FLAXSEED OIL) 1000 MG CAPS Take by mouth.     ibuprofen (ADVIL,MOTRIN) 200 MG tablet Take 200 mg by mouth every 6 (six) hours as needed.     latanoprost (XALATAN) 0.005 % ophthalmic solution Place 1 drop into both eyes at bedtime.     losartan (COZAAR) 50 MG tablet Take 50 mg by mouth daily.     Omega-3 Fatty Acids (FISH OIL) 1000 MG CPDR Take by mouth.     No current facility-administered medications for this visit.    OBJECTIVE: African woman in no acute distress Vitals:   07/10/21 1248  BP: 126/68  Pulse: 66  Resp: 18  Temp: 97.8 F (36.6 C)  SpO2: 100%      Body mass index is 29.81 kg/m.    ECOG FS:1 - Symptomatic but completely ambulatory  Sclerae unicteric, EOMs intact Wearing a mask No cervical or supraclavicular adenopathy Lungs no rales or rhonchi Heart regular rate and rhythm Abd soft, nontender, positive bowel sounds MSK no focal spinal tenderness, no upper extremity lymphedema Neuro: nonfocal, well oriented, appropriate affect Breasts: The  right breast is unremarkable.  The left breast status postlumpectomy and radiation with no evidence of local recurrence.   LAB RESULTS:  CMP     Component Value Date/Time   NA 135 06/02/2021 1305   NA 141 05/05/2017 0933   K 4.1 06/02/2021 1305   K 4.4 05/05/2017 0933   CL 99 06/02/2021 1305   CO2 27 06/02/2021 1305   CO2 27 05/05/2017 0933   GLUCOSE 90 06/02/2021 1305   GLUCOSE 113 05/05/2017 0933   BUN 18 06/02/2021 1305   BUN 14.7 05/05/2017 0933   CREATININE 1.08 (H) 06/02/2021 1305   CREATININE 1.19 (H) 05/22/2020 1406   CREATININE 1.0 05/05/2017 0933   CALCIUM 9.2 06/02/2021 1305   CALCIUM 9.7 05/05/2017 0933   PROT 7.9 05/22/2020 1406   PROT 7.9 05/05/2017 0933   ALBUMIN 3.8 05/22/2020 1406   ALBUMIN 3.9 05/05/2017 0933   AST 16 05/22/2020 1406   AST 18 05/05/2017 0933   ALT 12 05/22/2020 1406   ALT 19 05/05/2017 0933   ALKPHOS  70 05/22/2020 1406   ALKPHOS 71 05/05/2017 0933   BILITOT 0.4 05/22/2020 1406   BILITOT 0.62 05/05/2017 0933   GFRNONAA 53 (L) 06/02/2021 1305   GFRNONAA 44 (L) 05/22/2020 1406   GFRAA 51 (L) 05/22/2020 1406    INo results found for: SPEP, UPEP  Lab Results  Component Value Date   WBC 9.5 05/22/2020   NEUTROABS 4.9 05/22/2020   HGB 12.1 05/22/2020   HCT 34.8 (L) 05/22/2020   MCV 87.7 05/22/2020   PLT 267 05/22/2020      Chemistry      Component Value Date/Time   NA 135 06/02/2021 1305   NA 141 05/05/2017 0933   K 4.1 06/02/2021 1305   K 4.4 05/05/2017 0933   CL 99 06/02/2021 1305   CO2 27 06/02/2021 1305   CO2 27 05/05/2017 0933   BUN 18 06/02/2021 1305   BUN 14.7 05/05/2017 0933   CREATININE 1.08 (H) 06/02/2021 1305   CREATININE 1.19 (H) 05/22/2020 1406   CREATININE 1.0 05/05/2017 0933      Component Value Date/Time   CALCIUM 9.2 06/02/2021 1305   CALCIUM 9.7 05/05/2017 0933   ALKPHOS 70 05/22/2020 1406   ALKPHOS 71 05/05/2017 0933   AST 16 05/22/2020 1406   AST 18 05/05/2017 0933   ALT 12 05/22/2020 1406    ALT 19 05/05/2017 0933   BILITOT 0.4 05/22/2020 1406   BILITOT 0.62 05/05/2017 0933       No results found for: LABCA2  No components found for: LABCA125  No results for input(s): INR in the last 168 hours.  Urinalysis    Component Value Date/Time   COLORURINE YELLOW 04/04/2010 0228   APPEARANCEUR CLEAR 04/04/2010 0228   LABSPEC 1.021 04/04/2010 0228   PHURINE 8.5 (H) 04/04/2010 0228   GLUCOSEU NEGATIVE 04/04/2010 0228   HGBUR NEGATIVE 04/04/2010 0228   BILIRUBINUR NEGATIVE 04/04/2010 0228   KETONESUR NEGATIVE 04/04/2010 0228   PROTEINUR 30 (A) 04/04/2010 0228   UROBILINOGEN 0.2 04/04/2010 0228   NITRITE NEGATIVE 04/04/2010 0228   LEUKOCYTESUR SMALL (A) 04/04/2010 0228    ELIGIBLE FOR AVAILABLE RESEARCH PROTOCOL: No   STUDIES: No results found.   ASSESSMENT: 80 y.o. [place] woman Status post left breast lower inner quadrant biopsy 03/09/2016 for ductal carcinoma in situ, grade 1, estrogen and progesterone receptor positive  (1) status post left lumpectomy 04/15/2016 for a pT1b pNX, stage IA invasive ductal carcinoma measuring 0.7 cm, grade 2, with negative margins.  (2) adjuvant radiation completed 06/08/2016  (3) started anastrozole 07/28/2016, completing 5 years August 2022  (4) osteopenia, with a T score of -1.7 on bone density scan January 2017.   PLAN: Arlisa is now a little over 5years for definitive surgery for her breast cancer with no evidence of disease recurrence.  This is very favorable.  She is completing 5 years of anastrozole.  I do not think she would derive any significant benefit from continuing beyond this point so we are discontinuing that medication.  At this point I feel comfortable releasing her to her primary care physician.  All she will need is a yearly mammogram at [facility] and a yearly physician breast exam  I will be glad to see Ms. Zaragoza anytime in the future on an as-needed basis but as of now are making no further routine  appointments for her here.  Total encounter time 20 minutes.*   Abbas Beyene, Virgie Dad, MD  07/10/21 1:12 PM Medical Oncology and Hematology [address]  [place],  [address] Tel. [phone]    Fax. [phone]   I, Wilburn Mylar, am acting as scribe for Dr. Virgie Dad. Suzana Sohail.  I, Lurline Del MD, have reviewed the above documentation for accuracy and completeness, and I agree with the above.   *Total Encounter Time as defined by the Centers for Medicare and Medicaid Services includes, in addition to the face-to-face time of a patient visit (documented in the note above) non-face-to-face time: obtaining and reviewing outside history, ordering and reviewing medications, tests or procedures, care coordination (communications with other health care professionals or caregivers) and documentation in the medical record.

## 2021-07-10 ENCOUNTER — Inpatient Hospital Stay: Payer: Medicare Other | Attending: Oncology | Admitting: Oncology

## 2021-07-10 ENCOUNTER — Other Ambulatory Visit: Payer: Self-pay

## 2021-07-10 VITALS — BP 126/68 | HR 66 | Temp 97.8°F | Resp 18 | Ht 62.0 in | Wt 163.0 lb

## 2021-07-10 DIAGNOSIS — Z170 Estrogen receptor positive status [ER+]: Secondary | ICD-10-CM | POA: Insufficient documentation

## 2021-07-10 DIAGNOSIS — C50312 Malignant neoplasm of lower-inner quadrant of left female breast: Secondary | ICD-10-CM

## 2021-07-10 DIAGNOSIS — C50212 Malignant neoplasm of upper-inner quadrant of left female breast: Secondary | ICD-10-CM | POA: Insufficient documentation

## 2021-07-10 DIAGNOSIS — Z923 Personal history of irradiation: Secondary | ICD-10-CM | POA: Insufficient documentation

## 2021-07-10 DIAGNOSIS — M8580 Other specified disorders of bone density and structure, unspecified site: Secondary | ICD-10-CM | POA: Diagnosis not present

## 2021-07-21 ENCOUNTER — Ambulatory Visit: Payer: Medicare Other | Admitting: Oncology

## 2021-07-28 ENCOUNTER — Ambulatory Visit: Payer: Medicare Other | Admitting: Oncology

## 2021-07-29 ENCOUNTER — Ambulatory Visit: Payer: Medicare Other | Admitting: Oncology

## 2021-09-30 ENCOUNTER — Other Ambulatory Visit: Payer: Self-pay | Admitting: Oral Surgery

## 2021-09-30 DIAGNOSIS — R930 Abnormal findings on diagnostic imaging of skull and head, not elsewhere classified: Secondary | ICD-10-CM

## 2021-09-30 NOTE — Progress Notes (Unsigned)
    Chief Complaint:  Right lower jaw pain (points to angle of mandible) x 1 month. No h/o injury. Mostly hurts when chewing. Dull ache. Not relieved by heat.  Hygiene:  Fair to good   Past Medical History:   High Blood Pressure, Glaucoma, Pain and Clicking of Jaw   Medications:   Losartan, Ezetimibe, Eye drops, HCTZ  Allergies:   NKDA      Past Medical History:  Diagnosis Date   Breast cancer ([place])    Breast cancer of lower-inner quadrant of left female breast ([place]) 03/13/2016   Glaucoma    Hx of radiation therapy 05/22/16- 06/12/16   Left Breast   Hyperlipidemia    Hypertension    Osteoporosis     Past Surgical History:  Procedure Laterality Date   BREAST LUMPECTOMY WITH RADIOACTIVE SEED LOCALIZATION Left 04/15/2016   Procedure: BREAST LUMPECTOMY WITH RADIOACTIVE SEED LOCALIZATION;  Surgeon: Erroll Luna, MD;  Location: [facility];  Service: General;  Laterality: Left;   FOOT SURGERY Right     Family History  Problem Relation Age of Onset   Tuberculosis Mother     Radiographs Reviewed:  Panorex: Partially edentulous maxilla and mandible. Small oval radiolucency  at border of posterior aspect of ramus, approx 69mm x 75mm.    Exam: Mild tenderness to palpation right mandible angle area. Normal oral exam. No edema, fluctuance, tenderness, decay, mucosal lesions.   Assessment/Plan: CT to evaluate lesion left mandible. Recommend NSAIDs, ice, soft diet.   Cindy Whitaker 09/30/2021, 10:17 AM

## 2021-10-02 ENCOUNTER — Encounter (HOSPITAL_BASED_OUTPATIENT_CLINIC_OR_DEPARTMENT_OTHER): Payer: Self-pay

## 2021-10-02 ENCOUNTER — Ambulatory Visit (HOSPITAL_BASED_OUTPATIENT_CLINIC_OR_DEPARTMENT_OTHER)
Admission: RE | Admit: 2021-10-02 | Discharge: 2021-10-02 | Disposition: A | Discharge status: Home / Self Care | Payer: Medicare Other | Source: Ambulatory Visit | Attending: Oral Surgery | Admitting: Oral Surgery

## 2021-10-02 ENCOUNTER — Other Ambulatory Visit: Payer: Self-pay

## 2021-10-02 DIAGNOSIS — R930 Abnormal findings on diagnostic imaging of skull and head, not elsewhere classified: Secondary | ICD-10-CM | POA: Insufficient documentation

## 2021-10-02 LAB — POCT I-STAT CREATININE: Creatinine, Ser: 1.1 mg/dL — ABNORMAL HIGH (ref 0.44–1.00)

## 2021-10-02 MED ORDER — IOHEXOL 300 MG/ML  SOLN
75.0000 mL | Freq: Once | INTRAMUSCULAR | Status: AC | PRN
  Administered 2021-10-02: 75 mL via INTRAVENOUS
